# Patient Record
Sex: Female | Born: 1985 | Race: White | Hispanic: No | Marital: Single | Smoking: Never smoker
Health system: Southern US, Community
[De-identification: ages and names within clinical notes are randomized; demographics above are authoritative.]

## PROBLEM LIST (undated history)

## (undated) DIAGNOSIS — Q774 Achondroplasia: Secondary | ICD-10-CM

## (undated) DIAGNOSIS — D649 Anemia, unspecified: Secondary | ICD-10-CM

## (undated) HISTORY — PX: MANDIBLE SURGERY: SHX707

## (undated) HISTORY — PX: LEG SURGERY: SHX1003

## (undated) HISTORY — PX: GASTRIC BYPASS: SHX52

## (undated) HISTORY — DX: Anemia, unspecified: D64.9

## (undated) HISTORY — PX: TYMPANOSTOMY TUBE PLACEMENT: SHX32

## (undated) HISTORY — DX: Achondroplasia: Q77.4

---

## 2016-08-11 ENCOUNTER — Emergency Department (HOSPITAL_COMMUNITY)
Admission: EM | Admit: 2016-08-11 | Discharge: 2016-08-11 | Disposition: A | Discharge status: Home / Self Care | Payer: No Typology Code available for payment source | Attending: Emergency Medicine | Admitting: Emergency Medicine

## 2016-08-11 ENCOUNTER — Encounter (HOSPITAL_COMMUNITY): Payer: Self-pay | Admitting: Emergency Medicine

## 2016-08-11 ENCOUNTER — Emergency Department (HOSPITAL_COMMUNITY): Payer: No Typology Code available for payment source

## 2016-08-11 DIAGNOSIS — M542 Cervicalgia: Secondary | ICD-10-CM | POA: Diagnosis not present

## 2016-08-11 DIAGNOSIS — Y999 Unspecified external cause status: Secondary | ICD-10-CM | POA: Diagnosis not present

## 2016-08-11 DIAGNOSIS — Y92410 Unspecified street and highway as the place of occurrence of the external cause: Secondary | ICD-10-CM | POA: Diagnosis not present

## 2016-08-11 DIAGNOSIS — R079 Chest pain, unspecified: Secondary | ICD-10-CM | POA: Diagnosis not present

## 2016-08-11 DIAGNOSIS — Y939 Activity, unspecified: Secondary | ICD-10-CM | POA: Diagnosis not present

## 2016-08-11 LAB — PREGNANCY, URINE: Preg Test, Ur: NEGATIVE

## 2016-08-11 MED ORDER — IBUPROFEN 200 MG PO TABS
400.0000 mg | ORAL_TABLET | Freq: Once | ORAL | Status: AC
  Administered 2016-08-11: 400 mg via ORAL
  Filled 2016-08-11: qty 2

## 2016-08-11 MED ORDER — CYCLOBENZAPRINE HCL 10 MG PO TABS
5.0000 mg | ORAL_TABLET | Freq: Once | ORAL | Status: AC
  Administered 2016-08-11: 5 mg via ORAL
  Filled 2016-08-11: qty 1

## 2016-08-11 MED ORDER — IBUPROFEN 400 MG PO TABS: 400.0000 mg | ORAL_TABLET | Freq: Four times a day (QID) | ORAL | 0 refills | Status: DC | PRN

## 2016-08-11 MED ORDER — CYCLOBENZAPRINE HCL 5 MG PO TABS: 5.0000 mg | ORAL_TABLET | Freq: Two times a day (BID) | ORAL | 0 refills | Status: DC | PRN
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
## 2018-03-16 ENCOUNTER — Ambulatory Visit: Payer: Self-pay | Admitting: Obstetrics & Gynecology

## 2018-04-19 ENCOUNTER — Encounter: Payer: Self-pay | Admitting: Obstetrics & Gynecology

## 2018-04-19 ENCOUNTER — Ambulatory Visit (INDEPENDENT_AMBULATORY_CARE_PROVIDER_SITE_OTHER): Payer: BC Managed Care – PPO | Admitting: Obstetrics & Gynecology

## 2018-04-19 VITALS — BP 120/80 | Ht <= 58 in | Wt 150.0 lb

## 2018-04-19 DIAGNOSIS — Z0000 Encounter for general adult medical examination without abnormal findings: Secondary | ICD-10-CM | POA: Diagnosis not present

## 2018-04-19 DIAGNOSIS — Z124 Encounter for screening for malignant neoplasm of cervix: Secondary | ICD-10-CM

## 2018-04-19 DIAGNOSIS — N9419 Other specified dyspareunia: Secondary | ICD-10-CM

## 2018-04-19 NOTE — Patient Instructions (Signed)
PAP every three years Labs yearly (with PCP)   Dyspareunia, Female Dyspareunia is pain that is associated with sexual activity. This can affect any part of the genitals or lower abdomen, and there are many possible causes. This condition ranges from mild to severe. Depending on the cause, dyspareunia may get better with treatment, or it may return (recur) over time. What are the causes? The cause of this condition is not always known. Possible causes include:  Cancer.  Psychological factors, such as depression, anxiety, or previous traumatic experiences.  Severe pain and tenderness of the skin around the vagina (vulva) when it is touched (vulvar vestibulitis syndrome).  Infection of the pelvis or the vulva.  Infection of the vagina.  Painful, involuntary tightening (contraction) of the vaginal muscles when anything is put inside the vagina (vaginismus).  Allergic reaction.  Ovarian cysts.  Solid growths of tissue (tumors) in the ovaries or the uterus.  Scar tissue in the ovaries, vagina, or pelvis.  Vaginal dryness.  Thinning of the tissue (atrophy) of the vulva and vagina.  Skin conditions that affect the vulva (vulvar dermatoses), such as lichen sclerosus or lichen planus.  Endometriosis.  Tubal pregnancy.  A tilted uterus.  Uterine prolapse.  Adhesions in the vagina.  Bladder problems.  Intestinal problems.  Certain medicines.  Medical conditions such as diabetes, arthritis, or thyroid disease.  What increases the risk? The following factors may make you more likely to develop this condition:  Having experienced physical or sexual trauma.  Having given birth more than once.  Taking birth control pills.  Having gone through menopause.  Having recently given birth, typically within the past 3-6 months.  Breastfeeding.  What are the signs or symptoms? The main symptom of this condition is pain in any part of the genitals or lower abdomen during or  after sexual activity. This may include pain during sexual arousal, genital stimulation, or orgasm. Pain may get worse when anything is inserted into the vagina, or when the genitals are touched in any way, such as when sitting or wearing pants. Pain can range from mild to severe, depending on the cause of the condition. In some cases, symptoms go away with treatment and return (recur) at a later date. How is this diagnosed? This condition may be diagnosed based on:  Your symptoms, including: ? Where your pain is located. ? When your pain occurs.  Your medical history.  A physical exam. This may include a pelvic exam and a Pap test. This is a screening test that is used to check for signs of cancer of the vagina, cervix, and uterus.  Tests, including: ? Blood tests. ? Ultrasound. This uses sound waves to make a picture of the area that is being tested. ? Urine culture. This test involves checking a urine sample for signs of infection. ? Culture test. This is when your health care provider uses a swab to collect a sample of vaginal fluid. The sample is checked for signs of infection. ? X-rays. ? MRI. ? CT scan. ? Laparoscopy. This is a procedure in which a small incision is made in your lower abdomen and a lighted, pencil-sized instrument (laparoscope) is passed through the incision and used to look inside your pelvis.  You may be referred to a health care provider who specializes in women's health (gynecologist). In some cases, diagnosing the cause of dyspareunia can be difficult. How is this treated? Treatment depends on the cause of your condition and your symptoms. In most cases,  you may need to stop sexual activity until your symptoms improve. Treatment may include:  Lubricants.  Kegel exercises or vaginal dilators.  Medicated skin creams.  Medicated vaginal creams.  Hormonal therapy.  Antibiotic medicine to prevent or fight infection.  Medicines that help to relieve  pain.  Medicines that treat depression (antidepressants).  Psychological counseling.  Sex therapy.  Surgery.  Follow these instructions at home: Lifestyle  Avoid tight clothing and irritating materials around your genital and abdominal area.  Use water-based lubricants as needed. Avoid oil-based lubricants.  Do not use any products that irritate you. This may include certain condoms, spermicides, lubricants, soaps, tampons, vaginal sprays, or douches.  Always practice safe sex. Talk with your health care provider about which form of birth control (contraception) is best for you.  Maintain open communication with your sexual partner. General instructions  Take over-the-counter and prescription medicines only as told by your health care provider.  If you had tests done, it is your responsibility to get your tests results. Ask your health care provider or the department performing the test when your results will be ready.  Urinate before you engage in sexual activity.  Consider joining a support group.  Keep all follow-up visits as told by your health care provider. This is important. Contact a health care provider if:  You develop vaginal bleeding after sexual intercourse.  You develop a lump at the opening of your vagina. Seek medical care even if the lump is painless.  You have: ? Abnormal vaginal discharge. ? Vaginal dryness. ? Itchiness or irritation of your vulva or vagina. ? A new rash. ? Symptoms that get worse or do not improve with treatment. ? A fever. ? Pain when you urinate. ? Blood in your urine. Get help right away if:  You develop severe pain in your abdomen during or shortly after sexual intercourse.  You pass out after having sexual intercourse. This information is not intended to replace advice given to you by your health care provider. Make sure you discuss any questions you have with your health care provider. Document Released: 11/16/2007  Document Revised: 03/07/2016 Document Reviewed: 05/29/2015 Elsevier Interactive Patient Education  [employer]2018 [employer] Inc[employer]

## 2018-04-19 NOTE — Progress Notes (Signed)
HPI:      [name] is a 32 y.o. G0P0000 who LMP was Patient's last menstrual period was 03/29/2018., she presents today for her annual examination. The patient has no complaints today. The patient is sexually active. Her last pap: was normal. The patient does perform self breast exams.  There is no notable family history of breast or ovarian cancer in her family.  The patient has regular exercise: yes.  The patient denies current symptoms of depression.    GYN History: Contraception: none  PMHx: Past Medical History:  Diagnosis Date  . Dwarf, achondroplastic    Past Surgical History:  Procedure Laterality Date  . GASTRIC BYPASS     History reviewed. No pertinent family history. Social History   Tobacco Use  . Smoking status: Never Smoker  . Smokeless tobacco: Never Used  Substance Use Topics  . Alcohol use: No  . Drug use: Never    Current Outpatient Medications:  .  cyclobenzaprine (FLEXERIL) 5 MG tablet, Take 1 tablet (5 mg total) by mouth 2 (two) times daily as needed for muscle spasms., Disp: 10 tablet, Rfl: 0 .  ibuprofen (ADVIL,MOTRIN) 400 MG tablet, Take 1 tablet (400 mg total) by mouth every 6 (six) hours as needed., Disp: 30 tablet, Rfl: 0 Allergies: Sulfa antibiotics  Review of Systems  Constitutional: Negative for chills, fever and malaise/fatigue.  HENT: Negative for congestion, sinus pain and sore throat.   Eyes: Negative for blurred vision and pain.  Respiratory: Negative for cough and wheezing.   Cardiovascular: Negative for chest pain and leg swelling.  Gastrointestinal: Negative for abdominal pain, constipation, diarrhea, heartburn, nausea and vomiting.  Genitourinary: Negative for dysuria, frequency, hematuria and urgency.  Musculoskeletal: Negative for back pain, joint pain, myalgias and neck pain.  Skin: Negative for itching and rash.  Neurological: Negative for dizziness, tremors and weakness.  Endo/Heme/Allergies: Does not bruise/bleed  easily.  Psychiatric/Behavioral: Negative for depression. The patient is not nervous/anxious and does not have insomnia.     Objective: BP 120/80   Ht 4\' 2"  (1.27 m)   Wt 150 lb (68 kg)   LMP 03/29/2018   BMI 42.18 kg/m   Filed Weights   04/19/18 1542  Weight: 150 lb (68 kg)   Body mass index is 42.18 kg/m. Physical Exam  Constitutional: She is oriented to person, place, and time. She appears well-developed and well-nourished. No distress.  Genitourinary: Rectum normal, vagina normal and uterus normal. Pelvic exam was performed with patient supine. There is no rash or lesion on the right labia. There is no rash or lesion on the left labia. Vagina exhibits no lesion. No bleeding in the vagina. Right adnexum does not display mass and does not display tenderness. Left adnexum does not display mass and does not display tenderness. Cervix does not exhibit motion tenderness, lesion, friability or polyp.   Uterus is mobile and midaxial. Uterus is not enlarged or exhibiting a mass.  HENT:  Head: Normocephalic and atraumatic. Head is without laceration.  Right Ear: Hearing normal.  Left Ear: Hearing normal.  Nose: No epistaxis.  No foreign bodies.  Mouth/Throat: Uvula is midline, oropharynx is clear and moist and mucous membranes are normal.  Eyes: Pupils are equal, round, and reactive to light.  Neck: Normal range of motion. Neck supple. No thyromegaly present.  Cardiovascular: Normal rate and regular rhythm. Exam reveals no gallop and no friction rub.  No murmur heard. Pulmonary/Chest: Effort normal and breath sounds normal. No respiratory distress. She  has no wheezes. Right breast exhibits no mass, no skin change and no tenderness. Left breast exhibits no mass, no skin change and no tenderness.  Abdominal: Soft. Bowel sounds are normal. She exhibits no distension. There is no tenderness. There is no rebound.  Musculoskeletal: Normal range of motion.  Neurological: She is alert and  oriented to person, place, and time. No cranial nerve deficit.  Skin: Skin is warm and dry.  Psychiatric: She has a normal mood and affect. Judgment normal.  Vitals reviewed.   Assessment:  ANNUAL EXAM 1. Annual physical exam   2. Dyspareunia due to medical condition in female   3. Screening for cervical cancer     Screening Plan:            1.  Cervical Screening-  Pap smear done today  2. Labs managed by PCP  3. Counseling for contraception: no method currently; considering pill or patch until ready for fertility.  Counsel on 50% rate of passing dwarfism gene to child, and higher CS rate.  Other:  1. Annual physical exam  2. Dyspareunia due to medical condition in female US for assessment Endometriosis and other etiologies discussed as possible for dyspareunia Desires fertility someday    F/U  Return in about 1 week (around 04/26/2018) for Follow up w GYN US.  Annamarie MajorPaul Gurvir Schrom, MD, Merlinda FrederickFACOG [facility], [facility] 04/19/2018  4:10 PM

## 2018-04-22 ENCOUNTER — Encounter: Payer: Self-pay | Admitting: Obstetrics & Gynecology

## 2018-04-22 LAB — IGP, APTIMA HPV
HPV Aptima: POSITIVE — AB
PAP SMEAR COMMENT: 0

## 2018-04-22 NOTE — Progress Notes (Signed)
PAP w HPV, otherwise normal Plan PAP next year Letter to pt

## 2018-05-10 ENCOUNTER — Other Ambulatory Visit: Payer: BC Managed Care – PPO

## 2018-05-10 ENCOUNTER — Telehealth: Payer: Self-pay | Admitting: Obstetrics & Gynecology

## 2018-05-10 ENCOUNTER — Ambulatory Visit: Payer: BC Managed Care – PPO | Admitting: Obstetrics & Gynecology

## 2018-05-10 ENCOUNTER — Other Ambulatory Visit: Payer: Self-pay | Admitting: Obstetrics & Gynecology

## 2018-05-10 DIAGNOSIS — N9419 Other specified dyspareunia: Secondary | ICD-10-CM

## 2018-05-10 NOTE — Telephone Encounter (Signed)
Please put in new order for pt. Since u/s is not in today she needed to be rescheduled and the time I could bring her in was outside of the u/s order dates so it won't allow me to schedule it.

## 2018-05-20 ENCOUNTER — Ambulatory Visit (INDEPENDENT_AMBULATORY_CARE_PROVIDER_SITE_OTHER): Payer: BC Managed Care – PPO | Admitting: Obstetrics & Gynecology

## 2018-05-20 ENCOUNTER — Ambulatory Visit (INDEPENDENT_AMBULATORY_CARE_PROVIDER_SITE_OTHER): Payer: BC Managed Care – PPO

## 2018-05-20 ENCOUNTER — Encounter: Payer: Self-pay | Admitting: Obstetrics & Gynecology

## 2018-05-20 VITALS — BP 110/80 | Ht <= 58 in | Wt 150.0 lb

## 2018-05-20 DIAGNOSIS — B977 Papillomavirus as the cause of diseases classified elsewhere: Secondary | ICD-10-CM | POA: Diagnosis not present

## 2018-05-20 DIAGNOSIS — N9419 Other specified dyspareunia: Secondary | ICD-10-CM | POA: Diagnosis not present

## 2018-05-20 NOTE — Progress Notes (Signed)
  HPI: Pt has been having dyspareunia, it has been intermittant and for the last week no pain.    Ultrasound demonstrates no masses seen, no cyst, no other anamaly These findings are Pelvis normal  PMHx: She  has a past medical history of Dwarf, achondroplastic. Also,  has a past surgical history that includes Gastric bypass., family history is not on file.,  reports that she has never smoked. She has never used smokeless tobacco. She reports that she does not drink alcohol or use drugs.  She has a current medication list which includes the following prescription(s): cyclobenzaprine and ibuprofen. Also, is allergic to sulfa antibiotics.  Review of Systems  All other systems reviewed and are negative.  Objective: BP 110/80   Ht 4\' 2"  (1.27 m)   Wt 150 lb (68 kg)   LMP 05/06/2018   BMI 42.18 kg/m   Physical examination Constitutional NAD, Conversant  Skin No rashes, lesions or ulceration.   Extremities: Moves all appropriately.  Normal ROM for age. No lymphadenopathy.  Neuro: Grossly intact  Psych: Oriented to PPT.  Normal mood. Normal affect.   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  Annamarie MajorPaul Woodrow Drab, MD, Merlinda FrederickFACOG [facility], [facility] 05/20/2018  2:01 PM

## 2020-07-18 ENCOUNTER — Other Ambulatory Visit: Payer: Self-pay | Admitting: Physician Assistant

## 2020-07-18 ENCOUNTER — Ambulatory Visit (HOSPITAL_COMMUNITY)
Admission: RE | Admit: 2020-07-18 | Discharge: 2020-07-18 | Disposition: A | Discharge status: Home / Self Care | Payer: BC Managed Care – PPO | Source: Ambulatory Visit | Attending: Pulmonary Disease | Admitting: Pulmonary Disease

## 2020-07-18 DIAGNOSIS — E669 Obesity, unspecified: Secondary | ICD-10-CM

## 2020-07-18 DIAGNOSIS — U071 COVID-19: Secondary | ICD-10-CM

## 2020-07-18 DIAGNOSIS — E34328 Other genetic causes of short stature: Secondary | ICD-10-CM

## 2020-07-18 DIAGNOSIS — E343 Short stature due to endocrine disorder: Secondary | ICD-10-CM | POA: Diagnosis present

## 2020-07-18 MED ORDER — DIPHENHYDRAMINE HCL 50 MG/ML IJ SOLN: 50.0000 mg | Freq: Once | INTRAMUSCULAR | Status: DC | PRN

## 2020-07-18 MED ORDER — SODIUM CHLORIDE 0.9 % IV SOLN
1200.0000 mg | Freq: Once | INTRAVENOUS | Status: AC
  Administered 2020-07-18: 1200 mg via INTRAVENOUS
  Filled 2020-07-18: qty 10

## 2020-07-18 MED ORDER — ALBUTEROL SULFATE HFA 108 (90 BASE) MCG/ACT IN AERS: 2.0000 | INHALATION_SPRAY | Freq: Once | RESPIRATORY_TRACT | Status: DC | PRN

## 2020-07-18 MED ORDER — SODIUM CHLORIDE 0.9 % IV SOLN: INTRAVENOUS | Status: DC | PRN

## 2020-07-18 MED ORDER — METHYLPREDNISOLONE SODIUM SUCC 125 MG IJ SOLR: 125.0000 mg | Freq: Once | INTRAMUSCULAR | Status: DC | PRN

## 2020-07-18 MED ORDER — FAMOTIDINE IN NACL 20-0.9 MG/50ML-% IV SOLN: 20.0000 mg | Freq: Once | INTRAVENOUS | Status: DC | PRN

## 2020-07-18 MED ORDER — EPINEPHRINE 0.3 MG/0.3ML IJ SOAJ: 0.3000 mg | Freq: Once | INTRAMUSCULAR | Status: DC | PRN

## 2020-07-18 NOTE — Progress Notes (Signed)
I connected by phone with Gina Webster on 07/18/2020 at 11:34 AM to discuss the potential use of a new treatment for mild to moderate COVID-19 viral infection in non-hospitalized patients.  This patient is a 34 y.o. female that meets the FDA criteria for Emergency Use Authorization of COVID monoclonal antibody casirivimab/imdevimab.  Has a (+) direct SARS-CoV-2 viral test result  Has mild or moderate COVID-19   Is NOT hospitalized due to COVID-19  Is within 10 days of symptom onset  Has at least one of the high risk factor(s) for progression to severe COVID-19 and/or hospitalization as defined in EUA.  Specific high risk criteria : BMI > 25 and Other high risk medical condition per CDC:  Dwarfism   I have spoken and communicated the following to the patient or parent/caregiver regarding COVID monoclonal antibody treatment:  1. FDA has authorized the emergency use for the treatment of mild to moderate COVID-19 in adults and pediatric patients with positive results of direct SARS-CoV-2 viral testing who are 30 years of age and older weighing at least 40 kg, and who are at high risk for progressing to severe COVID-19 and/or hospitalization.  2. The significant known and potential risks and benefits of COVID monoclonal antibody, and the extent to which such potential risks and benefits are unknown.  3. Information on available alternative treatments and the risks and benefits of those alternatives, including clinical trials.  4. Patients treated with COVID monoclonal antibody should continue to self-isolate and use infection control measures (e.g., wear mask, isolate, social distance, avoid sharing personal items, clean and disinfect "high touch" surfaces, and frequent handwashing) according to CDC guidelines.   5. The patient or parent/caregiver has the option to accept or refuse COVID monoclonal antibody treatment.  After reviewing this information with the patient, The patient agreed  to proceed with receiving casirivimab\imdevimab infusion and will be provided a copy of the Fact sheet prior to receiving the infusion.   Sx onset 9/6. Set up for infusion on 9/8 @ 12:30. Directions given to [facility]. Pt is aware that insurance will be charged an infusion fee. She is fully vaccinated with Moderna.   Cline Crock 07/18/2020 11:34 AM

## 2020-07-18 NOTE — Discharge Instructions (Signed)

## 2020-07-18 NOTE — Progress Notes (Signed)
  Diagnosis: COVID-19  Physician: Dr Wright  Procedure: [facility] Med: casirivimab\imdevimab infusion - Provided patient with casirivimab\imdevimab fact sheet for patients, parents and caregivers prior to infusion.  Complications: No immediate complications noted.  Discharge: Discharged home   Brad Lieurance Jane 07/18/2020   

## 2020-07-19 ENCOUNTER — Ambulatory Visit: Payer: BC Managed Care – PPO | Admitting: Obstetrics & Gynecology

## 2020-08-15 ENCOUNTER — Ambulatory Visit: Payer: BC Managed Care – PPO | Admitting: Obstetrics & Gynecology

## 2020-09-07 ENCOUNTER — Ambulatory Visit: Payer: BC Managed Care – PPO | Admitting: Obstetrics & Gynecology

## 2020-11-07 ENCOUNTER — Other Ambulatory Visit (HOSPITAL_COMMUNITY)
Admission: RE | Admit: 2020-11-07 | Discharge: 2020-11-07 | Disposition: A | Discharge status: Home / Self Care | Payer: BC Managed Care – PPO | Source: Ambulatory Visit | Attending: Obstetrics & Gynecology | Admitting: Obstetrics & Gynecology

## 2020-11-07 ENCOUNTER — Other Ambulatory Visit: Payer: Self-pay

## 2020-11-07 ENCOUNTER — Encounter: Payer: Self-pay | Admitting: Obstetrics & Gynecology

## 2020-11-07 ENCOUNTER — Ambulatory Visit (INDEPENDENT_AMBULATORY_CARE_PROVIDER_SITE_OTHER): Payer: BC Managed Care – PPO | Admitting: Obstetrics & Gynecology

## 2020-11-07 VITALS — BP 120/80 | Wt 142.0 lb

## 2020-11-07 DIAGNOSIS — Z124 Encounter for screening for malignant neoplasm of cervix: Secondary | ICD-10-CM | POA: Insufficient documentation

## 2020-11-07 DIAGNOSIS — Z01419 Encounter for gynecological examination (general) (routine) without abnormal findings: Secondary | ICD-10-CM

## 2020-11-07 NOTE — Progress Notes (Signed)
HPI:      [name] is a 34 y.o. G0P0000 who LMP was Patient's last menstrual period was 10/23/2020., she presents today for her annual examination. The patient has no complaints today. The patient is sexually active. Her last pap: approximate date 2019 and was normal except for POS HPV.  The patient does perform self breast exams.  There is no notable family history of breast or ovarian cancer in her family.  The patient has regular exercise: yes.  The patient denies current symptoms of depression.    GYN History: Contraception: none  Periods reg and 3-4 days and heavy 2-3 days Planning wedding in Oct 2022, possible pregnancy thereafter  PMHx: Past Medical History:  Diagnosis Date  . Dwarf, achondroplastic    Past Surgical History:  Procedure Laterality Date  . GASTRIC BYPASS     History reviewed. No pertinent family history. Social History   Tobacco Use  . Smoking status: Never Smoker  . Smokeless tobacco: Never Used  Vaping Use  . Vaping Use: Never used  Substance Use Topics  . Alcohol use: No  . Drug use: Never    Current Outpatient Medications:  .  cyclobenzaprine (FLEXERIL) 5 MG tablet, Take 1 tablet (5 mg total) by mouth 2 (two) times daily as needed for muscle spasms. (Patient not taking: Reported on 11/07/2020), Disp: 10 tablet, Rfl: 0 .  ibuprofen (ADVIL,MOTRIN) 400 MG tablet, Take 1 tablet (400 mg total) by mouth every 6 (six) hours as needed. (Patient not taking: Reported on 11/07/2020), Disp: 30 tablet, Rfl: 0 Allergies: Sulfa antibiotics  Review of Systems  Constitutional: Negative for chills, fever and malaise/fatigue.  HENT: Negative for congestion, sinus pain and sore throat.   Eyes: Negative for blurred vision and pain.  Respiratory: Negative for cough and wheezing.   Cardiovascular: Negative for chest pain and leg swelling.  Gastrointestinal: Negative for abdominal pain, constipation, diarrhea, heartburn, nausea and vomiting.   Genitourinary: Negative for dysuria, frequency, hematuria and urgency.  Musculoskeletal: Negative for back pain, joint pain, myalgias and neck pain.  Skin: Negative for itching and rash.  Neurological: Negative for dizziness, tremors and weakness.  Endo/Heme/Allergies: Does not bruise/bleed easily.  Psychiatric/Behavioral: Negative for depression. The patient is not nervous/anxious and does not have insomnia.     Objective: BP 120/80   Wt 142 lb (64.4 kg)   LMP 10/23/2020   BMI 39.93 kg/m   Filed Weights   11/07/20 1522  Weight: 142 lb (64.4 kg)   Body mass index is 39.93 kg/m. Physical Exam Constitutional:      General: She is not in acute distress.    Appearance: She is well-developed and well-nourished.  Genitourinary:     Vagina, uterus and rectum normal.     There is no rash or lesion on the right labia.     There is no rash or lesion on the left labia.    No lesions in the vagina.     No vaginal bleeding.      Right Adnexa: not tender and no mass present.    Left Adnexa: not tender and no mass present.    No cervical motion tenderness, friability, lesion or polyp.     Uterus is mobile.     Uterus is not enlarged.     No uterine mass detected.    Uterus is midaxial.     Pelvic exam was performed with patient supine.  Breasts:     Right: No mass, skin change or  tenderness.     Left: No mass, skin change or tenderness.    HENT:     Head: Normocephalic and atraumatic. No laceration.     Right Ear: Hearing normal.     Left Ear: Hearing normal.     Nose: No epistaxis or foreign body.     Mouth/Throat:     Mouth: Oropharynx is clear and moist and mucous membranes are normal.     Pharynx: Uvula midline.  Eyes:     Pupils: Pupils are equal, round, and reactive to light.  Neck:     Thyroid: No thyromegaly.  Cardiovascular:     Rate and Rhythm: Normal rate and regular rhythm.     Heart sounds: No murmur heard. No friction rub. No gallop.   Pulmonary:      Effort: Pulmonary effort is normal. No respiratory distress.     Breath sounds: Normal breath sounds. No wheezing.  Abdominal:     General: Bowel sounds are normal. There is no distension.     Palpations: Abdomen is soft.     Tenderness: There is no abdominal tenderness. There is no rebound.  Musculoskeletal:        General: Normal range of motion.     Cervical back: Normal range of motion and neck supple.  Neurological:     Mental Status: She is alert and oriented to person, place, and time.     Cranial Nerves: No cranial nerve deficit.  Skin:    General: Skin is warm and dry.  Psychiatric:        Mood and Affect: Mood and affect normal.        Judgment: Judgment normal.  Vitals reviewed.     Assessment:  ANNUAL EXAM 1. Women's annual routine gynecological examination   2. Screening for cervical cancer      Screening Plan:            1.  Cervical Screening-  Pap smear done today  2. Breast screening- Exam annually and mammogram>40 planned   3.  Labs managed by PCP  4. Counseling for contraception: no method Plan son pregnancy after marriage in Oct 2022    F/U  Return in about 1 year (around 11/07/2021) for Annual.  Annamarie Major, MD, [facility], [facility] 11/07/2020  3:53 PM

## 2020-11-12 LAB — CYTOLOGY - PAP
Comment: NEGATIVE
Diagnosis: NEGATIVE
High risk HPV: NEGATIVE

## 2020-11-13 ENCOUNTER — Telehealth: Payer: Self-pay | Admitting: Obstetrics & Gynecology

## 2020-11-13 NOTE — Telephone Encounter (Signed)
Patient is calling for labs results. Please advise. 

## 2020-11-14 NOTE — Telephone Encounter (Signed)
PAP is normal and this is great news!  Follow up next year. Message left for patient

## 2020-12-13 ENCOUNTER — Ambulatory Visit: Payer: BC Managed Care – PPO | Admitting: Obstetrics & Gynecology

## 2020-12-14 ENCOUNTER — Ambulatory Visit (INDEPENDENT_AMBULATORY_CARE_PROVIDER_SITE_OTHER): Payer: BC Managed Care – PPO | Admitting: Obstetrics & Gynecology

## 2020-12-14 ENCOUNTER — Encounter: Payer: Self-pay | Admitting: Obstetrics & Gynecology

## 2020-12-14 ENCOUNTER — Other Ambulatory Visit: Payer: Self-pay

## 2020-12-14 VITALS — BP 118/74 | Wt 144.0 lb

## 2020-12-14 DIAGNOSIS — N926 Irregular menstruation, unspecified: Secondary | ICD-10-CM

## 2020-12-14 LAB — POCT URINE PREGNANCY: Preg Test, Ur: POSITIVE — AB

## 2020-12-14 NOTE — Progress Notes (Signed)
  HPI:      [name] is a 35 y.o. G0P0000 who LMP was Patient's last menstrual period was 10/26/2020., presents today for a problem visit.  She complains of missing a period but has been somewhat irregular, w LMP 10/26/20, and with dietary changes for weight loss felt it was related to that.  Then she took home preg test and one was positive.  Has breast T and nausea (mild).  No bleeding. Engaged.  OK w pregnancy if positive.  Pt has achondroplasia.  PMHx: She  has a past medical history of Dwarf, achondroplastic. Also,  has a past surgical history that includes Gastric bypass., family history is not on file.,  reports that she has never smoked. She has never used smokeless tobacco. She reports that she does not drink alcohol and does not use drugs.  She  Current Outpatient Medications:  .  cyclobenzaprine (FLEXERIL) 5 MG tablet, Take 1 tablet (5 mg total) by mouth 2 (two) times daily as needed for muscle spasms. (Patient not taking: No sig reported), Disp: 10 tablet, Rfl: 0 .  ibuprofen (ADVIL,MOTRIN) 400 MG tablet, Take 1 tablet (400 mg total) by mouth every 6 (six) hours as needed. (Patient not taking: No sig reported), Disp: 30 tablet, Rfl: 0  Also, is allergic to sulfa antibiotics.  Review of Systems  Constitutional: Negative for chills, fever and malaise/fatigue.  HENT: Negative for congestion, sinus pain and sore throat.   Eyes: Negative for blurred vision and pain.  Respiratory: Negative for cough and wheezing.   Cardiovascular: Negative for chest pain and leg swelling.  Gastrointestinal: Negative for abdominal pain, constipation, diarrhea, heartburn, nausea and vomiting.  Genitourinary: Negative for dysuria, frequency, hematuria and urgency.  Musculoskeletal: Negative for back pain, joint pain, myalgias and neck pain.  Skin: Negative for itching and rash.  Neurological: Negative for dizziness, tremors and weakness.  Endo/Heme/Allergies: Does not bruise/bleed easily.   Psychiatric/Behavioral: Negative for depression. The patient is not nervous/anxious and does not have insomnia.     Objective: BP 118/74   Wt 144 lb (65.3 kg)   LMP 10/26/2020   BMI 40.50 kg/m  Physical Exam Constitutional:      General: She is not in acute distress.    Appearance: Normal appearance. She is overweight and well-nourished.  Musculoskeletal:        General: Normal range of motion.  Neurological:     Mental Status: She is alert and oriented to person, place, and time.  Skin:    General: Skin is warm and dry.  Psychiatric:        Mood and Affect: Mood and affect normal.  Vitals reviewed.   uCG POS  ASSESSMENT/PLAN: Missed period, likely early pregnancy   Problem List Items Addressed This Visit    Visit Diagnoses    Missed period    -  Primary   Relevant Orders   POCT urine pregnancy (Completed)   Beta hCG quant (ref lab)   US OB LESS THAN 14 WEEKS WITH OB TRANSVAGINAL    PNV, recommendations made for pregnancy Plan beta and then [place] to assess  Annamarie Major, MD, [facility], [facility] 12/14/2020  5:26 PM

## 2020-12-17 ENCOUNTER — Other Ambulatory Visit: Payer: Self-pay

## 2020-12-17 ENCOUNTER — Other Ambulatory Visit: Payer: BC Managed Care – PPO

## 2020-12-17 DIAGNOSIS — N926 Irregular menstruation, unspecified: Secondary | ICD-10-CM

## 2020-12-18 LAB — BETA HCG QUANT (REF LAB): hCG Quant: 166667 m[IU]/mL

## 2020-12-20 ENCOUNTER — Other Ambulatory Visit: Payer: Self-pay

## 2020-12-20 ENCOUNTER — Ambulatory Visit (INDEPENDENT_AMBULATORY_CARE_PROVIDER_SITE_OTHER): Payer: BC Managed Care – PPO | Admitting: Obstetrics & Gynecology

## 2020-12-20 ENCOUNTER — Encounter: Payer: BC Managed Care – PPO | Admitting: Obstetrics

## 2020-12-20 ENCOUNTER — Encounter: Payer: Self-pay | Admitting: Obstetrics & Gynecology

## 2020-12-20 ENCOUNTER — Ambulatory Visit (INDEPENDENT_AMBULATORY_CARE_PROVIDER_SITE_OTHER): Payer: BC Managed Care – PPO

## 2020-12-20 VITALS — BP 120/80 | Wt 143.0 lb

## 2020-12-20 DIAGNOSIS — N926 Irregular menstruation, unspecified: Secondary | ICD-10-CM | POA: Diagnosis not present

## 2020-12-20 DIAGNOSIS — Z3A08 8 weeks gestation of pregnancy: Secondary | ICD-10-CM | POA: Diagnosis not present

## 2020-12-20 DIAGNOSIS — Q774 Achondroplasia: Secondary | ICD-10-CM | POA: Insufficient documentation

## 2020-12-20 LAB — POCT URINALYSIS DIPSTICK OB
Glucose, UA: NEGATIVE
POC,PROTEIN,UA: NEGATIVE

## 2020-12-20 NOTE — Patient Instructions (Signed)
Thank you for choosing [facility]. As part of our ongoing efforts to improve patient experience, we would appreciate your feedback. Please fill out the short survey that you will receive by mail or MyChart. Your opinion is important to us! -Dr Dane Bloch  Commonly Asked Questions During Pregnancy  Cats: A parasite can be excreted in cat feces.  To avoid exposure you need to have another person empty the little box.  If you must empty the litter box you will need to wear gloves.  Wash your hands after handling your cat.  This parasite can also be found in raw or undercooked meat so this should also be avoided.  Colds, Sore Throats, Flu: Please check your medication sheet to see what you can take for symptoms.  If your symptoms are unrelieved by these medications please call the office.  Dental Work: Most any dental work your dentist recommends is permitted.  X-rays should only be taken during the first trimester if absolutely necessary.  Your abdomen should be shielded with a lead apron during all x-rays.  Please notify your provider prior to receiving any x-rays.  Novocaine is fine; gas is not recommended.  If your dentist requires a note from us prior to dental work please call the office and we will provide one for you.  Exercise: Exercise is an important part of staying healthy during your pregnancy.  You may continue most exercises you were accustomed to prior to pregnancy.  Later in your pregnancy you will most likely notice you have difficulty with activities requiring balance like riding a bicycle.  It is important that you listen to your body and avoid activities that put you at a higher risk of falling.  Adequate rest and staying well hydrated are a must!  If you have questions about the safety of specific activities ask your provider.    Exposure to Children with illness: Try to avoid obvious exposure; report any symptoms to us when noted,  If you have chicken pos, red measles or mumps, you  should be immune to these diseases.   Please do not take any vaccines while pregnant unless you have checked with your OB provider.  Fetal Movement: After 28 weeks we recommend you do "kick counts" twice daily.  Lie or sit down in a calm quiet environment and count your baby movements "kicks".  You should feel your baby at least 10 times per hour.  If you have not felt 10 kicks within the first hour get up, walk around and have something sweet to eat or drink then repeat for an additional hour.  If count remains less than 10 per hour notify your provider.  Fumigating: Follow your pest control agent's advice as to how long to stay out of your home.  Ventilate the area well before re-entering.  Hemorrhoids:   Most over-the-counter preparations can be used during pregnancy.  Check your medication to see what is safe to use.  It is important to use a stool softener or fiber in your diet and to drink lots of liquids.  If hemorrhoids seem to be getting worse please call the office.   Hot Tubs:  Hot tubs Jacuzzis and saunas are not recommended while pregnant.  These increase your internal body temperature and should be avoided.  Intercourse:  Sexual intercourse is safe during pregnancy as long as you are comfortable, unless otherwise advised by your provider.  Spotting may occur after intercourse; report any bright red bleeding that is heavier than spotting.    Labor:  If you know that you are in labor, please go to the hospital.  If you are unsure, please call the office and let us help you decide what to do.  Lifting, straining, etc:  If your job requires heavy lifting or straining please check with your provider for any limitations.  Generally, you should not lift items heavier than that you can lift simply with your hands and arms (no back muscles)  Painting:  Paint fumes do not harm your pregnancy, but may make you ill and should be avoided if possible.  Latex or water based paints have less odor than  oils.  Use adequate ventilation while painting.  Permanents & Hair Color:  Chemicals in hair dyes are not recommended as they cause increase hair dryness which can increase hair loss during pregnancy.  " Highlighting" and permanents are allowed.  Dye may be absorbed differently and permanents may not hold as well during pregnancy.  Sunbathing:  Use a sunscreen, as skin burns easily during pregnancy.  Drink plenty of fluids; avoid over heating.  Tanning Beds:  Because their possible side effects are still unknown, tanning beds are not recommended.  Ultrasound Scans:  Routine ultrasounds are performed at approximately 20 weeks.  You will be able to see your baby's general anatomy an if you would like to know the gender this can usually be determined as well.  If it is questionable when you conceived you may also receive an ultrasound early in your pregnancy for dating purposes.  Otherwise ultrasound exams are not routinely performed unless there is a medical necessity.  Although you can request a scan we ask that you pay for it when conducted because insurance does not cover " patient request" scans.  Work: If your pregnancy proceeds without complications you may work until your due date, unless your physician or employer advises otherwise.  Round Ligament Pain/Pelvic Discomfort:  Sharp, shooting pains not associated with bleeding are fairly common, usually occurring in the second trimester of pregnancy.  They tend to be worse when standing up or when you remain standing for long periods of time.  These are the result of pressure of certain pelvic ligaments called "round ligaments".  Rest, Tylenol and heat seem to be the most effective relief.  As the womb and fetus grow, they rise out of the pelvis and the discomfort improves.  Please notify the office if your pain seems different than that described.  It may represent a more serious condition.   

## 2020-12-20 NOTE — Progress Notes (Signed)
  HPI: Pt reports mild nausea; no pain or bleeding.  This is her first pregnancy.  Planning wedding in May now instead of Oct.  Concern for her achondroplasia.  Steffanie Rainwater does not have achondroplasia.  Ultrasound demonstrates early IUP w FHTs and measurements c/w LMP as far as EDC of 08/01/21  Beta on 12/17/20: [phone]  PMHx: She  has a past medical history of Dwarf, achondroplastic. Also,  has a past surgical history that includes Gastric bypass., family history is not on file.,  reports that she has never smoked. She has never used smokeless tobacco. She reports that she does not drink alcohol and does not use drugs.  She has a current medication list which includes the following prescription(s): cyclobenzaprine and ibuprofen. Also, is allergic to sulfa antibiotics.  Review of Systems  All other systems reviewed and are negative.   Objective: BP 120/80   Wt 143 lb (64.9 kg)   LMP 10/23/2020   BMI 40.22 kg/m   Physical examination Constitutional NAD, Conversant  Skin No rashes, lesions or ulceration.   Extremities: Moves all appropriately.  Normal ROM for age. No lymphadenopathy.  Neuro: Grossly intact  Psych: Oriented to PPT.  Normal mood. Normal affect.   US OB LESS THAN 14 WEEKS WITH OB TRANSVAGINAL  Result Date: 12/20/2020 Patient Name: Gina Webster DOB: 05-Nov-1986 MRN: [id number] ULTRASOUND REPORT Location: [facility] Date of Service: 12/20/2020 Indications:dating Findings: Mason Jim intrauterine pregnancy is visualized with a CRL consistent with [redacted]w[redacted]d gestation, giving an (U/S) EDD of 08/01/2021. The (U/S) EDD is consistent with the clinically established EDD of 08/02/2021. FHR: 159 BPM CRL measurement: 16.0 mm Yolk sac is visualized and appears normal. Amnion: visualized and appears normal Right Ovary is normal in appearance. Left Ovary is normal appearance. Corpus luteal cyst:  Left ovary Survey of the adnexa demonstrates no adnexal masses. There is no free peritoneal fluid  in the cul de sac. Impression: 1. [redacted]w[redacted]d Viable Singleton Intrauterine pregnancy by U/S. 2. (U/S) EDD is consistent with Clinically established EDD of 08/02/2021. Recommendations: 1.Clinical correlation with the patient's History and Physical Exam. Gina Webster, RT Review of ULTRASOUND.    I have personally reviewed images and report of recent ultrasound done at [facility].    Plan of management to be discussed with patient. Gina Major, MD, FACOG [facility], [facility] 12/20/2020  2:00 PM    Assessment:  Missed period [redacted] weeks gestation of pregnancy - newly diagnosed Achondroplasia  PNL and NIPT in 2 weeks  Pt is counseled : Pregnant women with achondroplasia will require cesarean section for delivery due to their small pelvic size. In addition, a woman with achondroplasia has a 50 percent chance of having an affected child who will have macrocephaly, another clear indication for a cesarean section.  A total of 20 minutes were spent face-to-face with the patient as well as preparation, review, communication, and documentation during this encounter.   Gina Major, MD, [facility], [facility] 12/20/2020  2:13 PM

## 2020-12-24 ENCOUNTER — Other Ambulatory Visit: Payer: Self-pay | Admitting: Obstetrics & Gynecology

## 2020-12-24 DIAGNOSIS — Z369 Encounter for antenatal screening, unspecified: Secondary | ICD-10-CM

## 2021-01-02 ENCOUNTER — Other Ambulatory Visit: Payer: Self-pay

## 2021-01-02 ENCOUNTER — Ambulatory Visit (INDEPENDENT_AMBULATORY_CARE_PROVIDER_SITE_OTHER): Payer: BC Managed Care – PPO | Admitting: Obstetrics & Gynecology

## 2021-01-02 VITALS — BP 120/80 | Wt 145.0 lb

## 2021-01-02 DIAGNOSIS — Z3A09 9 weeks gestation of pregnancy: Secondary | ICD-10-CM

## 2021-01-02 DIAGNOSIS — Z1379 Encounter for other screening for genetic and chromosomal anomalies: Secondary | ICD-10-CM

## 2021-01-02 DIAGNOSIS — Z369 Encounter for antenatal screening, unspecified: Secondary | ICD-10-CM

## 2021-01-02 DIAGNOSIS — O0991 Supervision of high risk pregnancy, unspecified, first trimester: Secondary | ICD-10-CM

## 2021-01-02 DIAGNOSIS — O0992 Supervision of high risk pregnancy, unspecified, second trimester: Secondary | ICD-10-CM | POA: Insufficient documentation

## 2021-01-02 NOTE — Progress Notes (Signed)
  Subjective  Fetal Movement? no Nausea? mild Leaking Fluid? no Vaginal Bleeding? no  Objective  BP 120/80   Wt 145 lb (65.8 kg)   LMP 10/23/2020   BMI 40.78 kg/m  General: NAD Pumonary: no increased work of breathing Abdomen: gravid, non-tender Extremities: no edema Psychiatric: mood appropriate, affect full  Assessment  35 y.o. G1P0000 at [redacted]w[redacted]d by  08/01/2021, by Ultrasound presenting for routine prenatal visit  Plan   Problem List Items Addressed This Visit    Visit Diagnoses    Encounter for genetic screening for Down Syndrome    -  Primary   Relevant Orders   MaterniT21 PLUS Core+SCA   Prenatal screening encounter       Relevant Orders   RPR+Rh+ABO+Rub Ab+Ab Scr+CB.Marland Kitchen.   [redacted] weeks gestation of pregnancy        Labs today, NIPT discussed PNV   Annamarie Major, MD, [facility], [facility] 01/02/2021  3:21 PM

## 2021-01-02 NOTE — Patient Instructions (Signed)
Genetic Testing During Pregnancy Why is genetic testing done? Genetic testing during pregnancy is also called prenatal genetic testing. This type of testing can determine if your baby is at risk of being born with a disorder caused by abnormal genes or chromosomes (genetic disorder). Chromosomes contain genes that control how your baby will develop in your womb. There are many different genetic disorders. Examples of genetic disorders that may be found through genetic testing include Down syndrome and cystic fibrosis. Gene changes (mutations) can be passed down through families. Genetic testing is offered to women during pregnancy. You can choose whether to have genetic testing. Having genetic testing allows you to:  Discuss your test results and options with your health care provider.  Prepare for a baby that may be born with a genetic disorder. Learning about the disorder ahead of time helps you be better prepared to manage it. Your health care providers can also be prepared in case your baby requires special care before or after birth.  Consider whether you want to continue with the pregnancy. In some cases, genetic testing may be done to learn about the traits a child will inherit. Types of genetic tests There are two basic types of genetic testing. Screening tests indicate whether your developing baby (fetus) is at higher risk for a genetic disorder. Diagnostic tests check actual fetal cells to diagnose a genetic disorder. Screening tests Screening tests will not harm your baby. They are recommended for all pregnant women. Types of screening tests include:  Carrier screening. This test involves checking genes from both parents by testing their blood or saliva. The test checks to find out if the parents carry a genetic mutation that may be passed to a baby. In most cases, both parents must carry the mutation for a baby to be at risk.  First trimester screening. This test combines a blood test  with sound wave imaging of your baby (fetal ultrasound). This screening test checks for a risk of Down syndrome or other defects caused by having extra chromosomes. The ultrasound also checks for defects of the heart, abdomen, or skeleton.  Second trimester screening also combines a blood test with a fetal ultrasound exam. This test checks for a risk of Down syndrome or other defects caused by having extra chromosomes. The ultrasound allows your health care provider to look for genetic defects of the face, brain, spine, heart, or limbs. Some women may choose to only have an ultrasound exam without a blood test.  Combined or sequential screening. This type of testing combines the results of first and second trimester screening. This type of testing may be more accurate than first or second trimester screening alone.  Cell-free DNA testing. This is a blood test that detects cells released by the placenta that get into the mother's blood. It can be used to check for a risk of Down syndrome, other extra chromosome syndromes, and disorders caused by abnormal numbers of sex chromosomes. This test can be done any time after 10 weeks of pregnancy.      Diagnostic tests Diagnostic tests carry slight risks of problems, including bleeding, infection, and loss of the pregnancy. These tests are done only if your baby is at risk for a genetic disorder. Your health care provider will discuss the risks and benefits of having diagnostic tests before performing these types of tests. Examples of diagnostic tests include:  Chorionic villus sampling (CVS). This involves a procedure to remove and test a sample of cells taken from the   placenta. The procedure may be done between 10 and 12 weeks of pregnancy.  Amniocentesis. This involves a procedure to remove and test a sample of fluid (amniotic fluid) and cells from the sac that surrounds the developing baby. The procedure may be done any time during the pregnancy, but it is  usually done between 15 and 20 weeks of pregnancy. What do the results mean? For a screening test:  If the results are negative, it often means that your child is not at higher risk. There is still a slight chance your child could have a genetic disorder.  If the results are positive, it does not mean your child will have a genetic disorder. It may mean that your child has a higher-than-normal risk for a genetic disorder. In that case, you should talk with your health care provider about whether you should have diagnostic genetic tests. For a diagnostic test:  If the result is negative, it is unlikely that your child will have a genetic disorder.  If the test is positive for a genetic disorder, it is likely that your child will have the disorder. The test may not tell how severe the disorder will be. Talk with your health care provider about your options. Talk with your health care provider about what your results mean. Questions to ask your health care provider Before talking to your health care provider about genetic testing, find out if there is a history of genetic disorders in your family. It may also help to know your family's ethnic origins. Then ask your health care provider the following questions:  Is my baby at risk for a genetic disorder?  What are the benefits of having genetic screening?  What tests are best for me and my baby?  What are the risks of each test?  If I get a positive result on a screening test, what is the next step?  Should I meet with a genetic counselor?  Should my partner or other members of my family be tested?  How much do the tests cost? Will my insurance cover the testing? Summary  Genetic testing is done during pregnancy to find out whether your child is at risk for a genetic disorder.  Genetic testing is offered to women during pregnancy. You can choose whether to have genetic testing.  There are two basic types of genetic testing. Screening  tests indicate whether your developing baby (fetus) is at higher risk for a genetic disorder. Diagnostic tests check actual fetal cells to diagnose a genetic disorder.  If a diagnostic genetic test is positive, talk with your health care provider about your options. This information is not intended to replace advice given to you by your health care provider. Make sure you discuss any questions you have with your health care provider. Document Revised: 05/18/2020 Document Reviewed: 05/18/2020 Elsevier Patient Education  2021 [employer]  

## 2021-01-03 LAB — RPR+RH+ABO+RUB AB+AB SCR+CB...
Antibody Screen: NEGATIVE
HIV Screen 4th Generation wRfx: NONREACTIVE
Hematocrit: 30 % — ABNORMAL LOW (ref 34.0–46.6)
Hemoglobin: 9.3 g/dL — ABNORMAL LOW (ref 11.1–15.9)
Hepatitis B Surface Ag: NEGATIVE
MCH: 21.7 pg — ABNORMAL LOW (ref 26.6–33.0)
MCHC: 31 g/dL — ABNORMAL LOW (ref 31.5–35.7)
MCV: 70 fL — ABNORMAL LOW (ref 79–97)
Platelets: 308 10*3/uL (ref 150–450)
RBC: 4.29 x10E6/uL (ref 3.77–5.28)
RDW: 16.8 % — ABNORMAL HIGH (ref 11.7–15.4)
RPR Ser Ql: NONREACTIVE
Rh Factor: POSITIVE
Rubella Antibodies, IGG: 1.08 index (ref >0.99)
Varicella zoster IgG: 1820 index (ref >165)
WBC: 6.7 10*3/uL (ref 3.4–10.8)

## 2021-01-04 ENCOUNTER — Telehealth: Payer: Self-pay

## 2021-01-04 NOTE — Telephone Encounter (Signed)
Pt calling; is 10w; has yeast inf; what to do?  [phone]  [facility]

## 2021-01-04 NOTE — Telephone Encounter (Signed)
Adv pt to use monistat 3d or 7d; if after completing the course she doesn't feel completely better to call and sched appt but do not use anything down there the night before appt.

## 2021-01-07 LAB — MATERNIT21 PLUS CORE+SCA
Fetal Fraction: 9
Monosomy X (Turner Syndrome): NOT DETECTED
Result (T21): NEGATIVE
Trisomy 13 (Patau syndrome): NEGATIVE
Trisomy 18 (Edwards syndrome): NEGATIVE
Trisomy 21 (Down syndrome): NEGATIVE
XXX (Triple X Syndrome): NOT DETECTED
XXY (Klinefelter Syndrome): NOT DETECTED
XYY (Jacobs Syndrome): NOT DETECTED

## 2021-01-08 ENCOUNTER — Telehealth: Payer: Self-pay

## 2021-01-08 NOTE — Telephone Encounter (Signed)
Pt states she sees Dr. Elby Beck in [place]; she will contact him and if needs hematology consult will let us know.

## 2021-01-08 NOTE — Telephone Encounter (Signed)
Yes, see who she has seen in past for this; otherwise we will need to get Hematology consult

## 2021-01-08 NOTE — Telephone Encounter (Signed)
Pt calling; saw low iron on her test results; has had gastric bypass 13yrs ago; low iron is d/t gastric bypass; does she need iron infusion or what to do?  [phone]

## 2021-01-09 NOTE — Telephone Encounter (Signed)
Pt calling; spoke c PH yesterday about seeing a hematologist; her primary MD would like for her to see a hematologist in the area.  [phone]

## 2021-01-10 ENCOUNTER — Other Ambulatory Visit: Payer: Self-pay | Admitting: Obstetrics & Gynecology

## 2021-01-10 DIAGNOSIS — D508 Other iron deficiency anemias: Secondary | ICD-10-CM

## 2021-01-10 NOTE — Telephone Encounter (Signed)
Pt aware ref sent in and will get a call either from [facility] or the hematologist's office.

## 2021-01-10 NOTE — Telephone Encounter (Signed)
Referral hematology placed; thx

## 2021-01-14 ENCOUNTER — Telehealth: Payer: Self-pay

## 2021-01-14 NOTE — Telephone Encounter (Signed)
Pt calling; is concerned about her iron levels; is 2wks out before she can get an iron infusion; is nervous about the baby; states too ridiculous  - had to call hematology dept four times before she got someone to answer the phone; is frustrated, scared, overwhelmed, upset this has taken this long.  [phone]

## 2021-01-18 ENCOUNTER — Inpatient Hospital Stay: Payer: BC Managed Care – PPO

## 2021-01-18 ENCOUNTER — Inpatient Hospital Stay: Payer: BC Managed Care – PPO | Attending: Oncology | Admitting: Oncology

## 2021-01-18 ENCOUNTER — Other Ambulatory Visit: Payer: Self-pay

## 2021-01-18 ENCOUNTER — Encounter: Payer: Self-pay | Admitting: Oncology

## 2021-01-18 VITALS — BP 106/69 | HR 88 | Temp 98.4°F | Resp 16 | Ht <= 58 in | Wt 145.0 lb

## 2021-01-18 DIAGNOSIS — D509 Iron deficiency anemia, unspecified: Secondary | ICD-10-CM

## 2021-01-18 DIAGNOSIS — D508 Other iron deficiency anemias: Secondary | ICD-10-CM | POA: Diagnosis present

## 2021-01-18 DIAGNOSIS — Z79899 Other long term (current) drug therapy: Secondary | ICD-10-CM | POA: Diagnosis not present

## 2021-01-18 DIAGNOSIS — O99011 Anemia complicating pregnancy, first trimester: Secondary | ICD-10-CM

## 2021-01-18 DIAGNOSIS — O99012 Anemia complicating pregnancy, second trimester: Secondary | ICD-10-CM

## 2021-01-18 LAB — CBC WITH DIFFERENTIAL/PLATELET
Abs Immature Granulocytes: 0.02 10*3/uL (ref 0.00–0.07)
Basophils Absolute: 0 10*3/uL (ref 0.0–0.1)
Basophils Relative: 1 %
Eosinophils Absolute: 0.1 10*3/uL (ref 0.0–0.5)
Eosinophils Relative: 1 %
HCT: 30.7 % — ABNORMAL LOW (ref 36.0–46.0)
Hemoglobin: 9.3 g/dL — ABNORMAL LOW (ref 12.0–15.0)
Immature Granulocytes: 0 %
Lymphocytes Relative: 34 %
Lymphs Abs: 2.2 10*3/uL (ref 0.7–4.0)
MCH: 21.5 pg — ABNORMAL LOW (ref 26.0–34.0)
MCHC: 30.3 g/dL (ref 30.0–36.0)
MCV: 71.1 fL — ABNORMAL LOW (ref 80.0–100.0)
Monocytes Absolute: 0.4 10*3/uL (ref 0.1–1.0)
Monocytes Relative: 7 %
Neutro Abs: 3.7 10*3/uL (ref 1.7–7.7)
Neutrophils Relative %: 57 %
Platelets: 300 10*3/uL (ref 150–400)
RBC: 4.32 MIL/uL (ref 3.87–5.11)
RDW: 18.7 % — ABNORMAL HIGH (ref 11.5–15.5)
WBC: 6.5 10*3/uL (ref 4.0–10.5)
nRBC: 0 % (ref 0.0–0.2)

## 2021-01-18 LAB — IRON AND TIBC
Iron: 83 ug/dL (ref 28–170)
Saturation Ratios: 18 % (ref 10.4–31.8)
TIBC: 466 ug/dL — ABNORMAL HIGH (ref 250–450)
UIBC: 383 ug/dL

## 2021-01-18 LAB — VITAMIN B12: Vitamin B-12: 323 pg/mL (ref 180–914)

## 2021-01-18 LAB — TSH: TSH: 1.751 u[IU]/mL (ref 0.350–4.500)

## 2021-01-18 LAB — FERRITIN: Ferritin: 5 ng/mL — ABNORMAL LOW (ref 11–307)

## 2021-01-18 LAB — FOLATE: Folate: 32 ng/mL (ref >5.9)

## 2021-01-18 NOTE — Progress Notes (Signed)
Pt is 3 months pregant and taking ferrous sulfate and pre natal vit. But she was told she needs iro infusion. She has iron tx before and it was around 2017.

## 2021-01-20 ENCOUNTER — Encounter: Payer: Self-pay | Admitting: Oncology

## 2021-01-20 DIAGNOSIS — O99011 Anemia complicating pregnancy, first trimester: Secondary | ICD-10-CM | POA: Insufficient documentation

## 2021-01-20 NOTE — Progress Notes (Signed)
Hematology/Oncology Consult note [facility] Telephone:([phone] Fax:([phone]) [phone]  Patient Care Team: Patient, No Pcp Per as PCP - General (General Practice)   Name of the patient: Gina Webster  [id number]  1986-03-13    Reason for referral- iron deficiency anemia   Referring physician- Dr. Effie Shy  Date of visit: 01/20/21   History of presenting illness- Patient is a 35 year old female with a past medical history significant for dwarfism, achondroplasia referred for microcytic anemia in the first trimester of pregnancy.  Most recent CBC from 01/02/2021 showed white cell count of 6.7, H&H of 9.3/30 with an MCV of 70 and a platelet count of 308.  I do not have any prior counts for comparison.  No recent iron studies have been checked.  Patient reports mild fatigue but denies other complaints at this time.  This is her first pregnancy  ECOG PS- 0  Pain scale- 0   Review of systems- Review of Systems  Constitutional: Positive for malaise/fatigue. Negative for chills, fever and weight loss.  HENT: Negative for congestion, ear discharge and nosebleeds.   Eyes: Negative for blurred vision.  Respiratory: Negative for cough, hemoptysis, sputum production, shortness of breath and wheezing.   Cardiovascular: Negative for chest pain, palpitations, orthopnea and claudication.  Gastrointestinal: Negative for abdominal pain, blood in stool, constipation, diarrhea, heartburn, melena, nausea and vomiting.  Genitourinary: Negative for dysuria, flank pain, frequency, hematuria and urgency.  Musculoskeletal: Negative for back pain, joint pain and myalgias.  Skin: Negative for rash.  Neurological: Negative for dizziness, tingling, focal weakness, seizures, weakness and headaches.  Endo/Heme/Allergies: Does not bruise/bleed easily.  Psychiatric/Behavioral: Negative for depression and suicidal ideas. The patient does not have insomnia.     Allergies  Allergen  Reactions  . Sulfa Antibiotics Rash    Patient Active Problem List   Diagnosis Date Noted  . Iron deficiency anemia secondary to inadequate dietary iron intake 01/10/2021  . High-risk pregnancy, first trimester 01/02/2021  . Achondroplasia 12/20/2020  . 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 Dyspareunia due to medical condition in female 04/19/2018     Past Medical History:  Diagnosis Date  . Anemia   . Dwarf, achondroplastic      Past Surgical History:  Procedure Laterality Date  . GASTRIC BYPASS      Social History   Socioeconomic History  . Marital status: Single    Spouse name: Not on file  . Number of children: Not on file  . Years of education: Not on file  . Highest education level: Not on file  Occupational History  . Not on file  Tobacco Use  . Smoking status: Never Smoker  . Smokeless tobacco: Never Used  Vaping Use  . Vaping Use: Never used  Substance and Sexual Activity  . Alcohol use: No  . Drug use: Never  . Sexual activity: Yes    Birth control/protection: None  Other Topics Concern  . Not on file  Social History Narrative  . Not on file   Social Determinants of Health   Financial Resource Strain: Not on file  Food Insecurity: Not on file  Transportation Needs: Not on file  Physical Activity: Not on file  Stress: Not on file  Social Connections: Not on file  Intimate Partner Violence: Not on file     Family History  Problem Relation Age of Onset  . Heart disease Father      Current Outpatient Medications:  .  ferrous sulfate 325 (65 FE) MG tablet,  Take 325 mg by mouth daily with breakfast., Disp: , Rfl:  .  prenatal vitamin w/FE, FA (NATACHEW) 29-1 MG CHEW chewable tablet, Chew 2 tablets by mouth daily at 12 noon., Disp: , Rfl:    Physical exam:  Vitals:   01/18/21 1348  BP: 106/69  Pulse: 88  Resp: 16  Temp: 98.4 F (36.9 C)  TempSrc: Oral  Weight: 145 lb (65.8 kg)  Height: 4\' 2"  (1.27 m)   Physical Exam Constitutional:       General: She is not in acute distress. Cardiovascular:     Rate and Rhythm: Normal rate and regular rhythm.     Heart sounds: Normal heart sounds.  Pulmonary:     Effort: Pulmonary effort is normal.     Breath sounds: Normal breath sounds.  Abdominal:     General: Bowel sounds are normal.     Palpations: Abdomen is soft.  Skin:    General: Skin is warm and dry.  Neurological:     Mental Status: She is alert and oriented to person, place, and time.        No flowsheet data found. CBC Latest Ref Rng & Units 01/18/2021  WBC 4.0 - 10.5 K/uL 6.5  Hemoglobin 12.0 - 15.0 g/dL 03/20/2021)  Hematocrit 7.7(O - 46.0 % 30.7(L)  Platelets 150 - 400 K/uL 300    Assessment and plan- Patient is a 35 y.o. female referred for microcytic anemia in the first trimester of pregnancy  Patient's most recent hemoglobin showed evidence of microcytosis with a hemoglobin of 9.3 suggestive of iron deficiency.  Today I will check her ferritin and iron studies B12 and folate as well as TSH.  We will have to wait for at least 2 weeks for her to get past 13 weeks/first trimester before giving her IV iron.  Discussed risks and benefits of IV iron including all but not limited to headache, possible risk of infusion reaction.  I recommend 5 doses of Venofer 200 mg IV given over 3 weeks starting after 2 weeks.  I will tentatively see her back in 8 weeks with CBC ferritin and iron studies  Etiology of iron deficiency anemia possibly secondary to pregnancy and increased demands.  We will follow postpartum as well   Thank you for this kind referral and the opportunity to participate in the care of this patient   Visit Diagnosis 1. Anemia affecting pregnancy in second trimester   2. Iron deficiency anemia, unspecified iron deficiency anemia type     Dr. 31, MD, MPH [facility] 01/20/2021  3:27 PM

## 2021-01-22 ENCOUNTER — Other Ambulatory Visit: Payer: Self-pay

## 2021-01-22 ENCOUNTER — Ambulatory Visit: Payer: BC Managed Care – PPO | Attending: Maternal & Fetal Medicine

## 2021-01-22 ENCOUNTER — Ambulatory Visit (HOSPITAL_BASED_OUTPATIENT_CLINIC_OR_DEPARTMENT_OTHER): Payer: BC Managed Care – PPO

## 2021-01-22 DIAGNOSIS — O09511 Supervision of elderly primigravida, first trimester: Secondary | ICD-10-CM

## 2021-01-22 DIAGNOSIS — Z3A12 12 weeks gestation of pregnancy: Secondary | ICD-10-CM

## 2021-01-22 DIAGNOSIS — O99011 Anemia complicating pregnancy, first trimester: Secondary | ICD-10-CM | POA: Diagnosis not present

## 2021-01-22 DIAGNOSIS — O338 Maternal care for disproportion of other origin: Secondary | ICD-10-CM

## 2021-01-22 DIAGNOSIS — O09521 Supervision of elderly multigravida, first trimester: Secondary | ICD-10-CM | POA: Insufficient documentation

## 2021-01-22 DIAGNOSIS — O99891 Other specified diseases and conditions complicating pregnancy: Secondary | ICD-10-CM

## 2021-01-22 DIAGNOSIS — Z369 Encounter for antenatal screening, unspecified: Secondary | ICD-10-CM

## 2021-01-22 DIAGNOSIS — Q774 Achondroplasia: Secondary | ICD-10-CM

## 2021-01-22 NOTE — Progress Notes (Addendum)
Referring Provider:   Teresa Coombs, [facility] Length of Consultation: 30 minutes  Ms. Hopwood was referred to Maternal Fetal Care at [facility] for genetic counseling because of advanced maternal age and a personal history of Achondroplasia.  The patient will be 35 years old at the time of delivery.  This note summarizes the information we discussed.    We explained that the chance of a chromosome abnormality increases with maternal age.  Chromosomes and examples of chromosome problems were reviewed.  Humans typically have 46 chromosomes in each cell, with half passed through each sperm and egg.  Any change in the number or structure of chromosomes can increase the risk of problems in the physical and mental development of a pregnancy.   Based upon age of the patient and the current gestational age, the chance of any chromosome abnormality was 1 in 34. The chance of Down syndrome, the most common chromosome problem associated with maternal age, was 1 in 33.  The risk of chromosome problems is in addition to the 3% general population risk for birth defects and intellectual disabilities.  The greatest chance, of course, is that the baby would be born in good health.  We discussed the following prenatal screening and testing options for this pregnancy:  Cell free fetal DNA testing analyzes maternal blood to determine whether or not the baby may have Down syndrome, trisomy 36, or trisomy 75.  This test utilizes a maternal blood sample and DNA sequencing technology to isolate circulating cell free fetal DNA from maternal plasma.  The fetal DNA can then be analyzed for DNA sequences that are derived from the three most common chromosomes involved in aneuploidy, chromosomes 13, 18, and 21.  If the overall amount of DNA is greater than the expected level for any of these chromosomes, aneuploidy is suspected.  While we do not consider it a replacement for invasive testing and karyotype analysis, a  negative result from this testing would be reassuring, though not a guarantee of a normal chromosome complement for the baby.  An abnormal result is certainly suggestive of an abnormal chromosome complement, though we would still recommend CVS or amniocentesis to confirm any findings from this testing.  Ms. Tovey had prior cell free fetal DNA testing in this pregnancy which was negative for Trisomy 13, 18 and 21.  Results were also low risk for sex chromosome aneuploidies and revealed the fetal gender to be female.  This result significantly reduces the chance for these conditions in the pregnancy, but is still considered a screening test.   The chorionic villus sampling procedure is available for first trimester chromosome analysis.  This involves the withdrawal of a small amount of chorionic villi (tissue from the developing placenta).  Risk of pregnancy loss is estimated to be approximately 1 in 200 to 1 in 100 (0.5 to 1%).  There is approximately a 1% (1 in 100) chance that the CVS chromosome results will be unclear.  Chorionic villi cannot be tested for neural tube defects.     Maternal serum marker screening, a blood test that measures pregnancy proteins, can provide risk assessments for Down syndrome, trisomy 18, and open neural tube defects (spina bifida, anencephaly). Because it does not directly examine the fetus, it cannot positively diagnose or rule out these problems. The detection rate is approximately 75% for Down syndrome, 70% for trisomy 18 and 80% of open neural tube defects. If prior chromosome screening has been performed, then AFP only is recommended to test  for open neural tube defects alone.  Targeted ultrasound uses high frequency sound waves to create an image of the developing fetus.  An ultrasound is often recommended as a routine means of evaluating the pregnancy.  It is also used to screen for fetal anatomy problems (for example, a heart defect) that might be suggestive of a  chromosomal or other abnormality.   Amniocentesis involves the removal of a small amount of amniotic fluid from the sac surrounding the fetus with the use of a thin needle inserted through the maternal abdomen and uterus.  Ultrasound guidance is used throughout the procedure.  Fetal cells from amniotic fluid are directly evaluated and > 99.5% of chromosome problems and > 98% of open neural tube defects can be detected. This procedure is generally performed after the 15th week of pregnancy.  The main risks to this procedure include complications leading to miscarriage in less than 1 in 200 cases (0.5%).  Cystic Fibrosis and Spinal Muscular Atrophy (SMA) screening were also discussed with the patient. Both conditions are recessive, which means that both parents must be carriers in order to have a child with the disease.  Cystic fibrosis (CF) is one of the most common genetic conditions in persons of Caucasian ancestry.  This condition occurs in approximately 1 in 2,500 Caucasian persons and results in thickened secretions in the lungs, digestive, and reproductive systems.  For a baby to be at risk for having CF, both of the parents must be carriers for this condition.  Approximately 1 in 19 Caucasian persons is a carrier for CF.  Current carrier testing looks for the most common mutations in the gene for CF and can detect approximately 90% of carriers in the Caucasian population.  This means that the carrier screening can greatly reduce, but cannot eliminate, the chance for an individual to have a child with CF.  If an individual is found to be a carrier for CF, then carrier testing would be available for the partner. As part of [place] newborn screening profile, all babies born in the state of [place] will have a two-tier screening process.  Specimens are first tested to determine the concentration of immunoreactive trypsinogen (IRT).  The top 5% of specimens with the highest IRT values then  undergo DNA testing using a panel of over 40 common CF mutations. SMA is a neurodegenerative disorder that leads to atrophy of skeletal muscle and overall weakness.  This condition is also more prevalent in the Caucasian population, with 1 in 40-1 in 60 persons being a carrier and 1 in 6,000-1 in 10,000 children being affected.  There are multiple forms of the disease, with some causing death in infancy to other forms with survival into adulthood.  The genetics of SMA is complex, but carrier screening can detect up to 95% of carriers in the Caucasian population.  Similar to CF, a negative result can greatly reduce, but cannot eliminate, the chance to have a child with SMA.  Hemoglobinopathy screening was also offered to the patient.  We obtained a detailed family history and pregnancy history.  Ms. 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  She has not had any genetic testing.  There are no other individuals in her family who are little people.  The father of the baby, Marya Amsler, reported that he was born with proximal femoral focal deficiency (PFFD).  No other other family members have this condition.  The remainder of the family history is unremarkable for birth  defects, developmental delays, recurrent pregnancy loss or known chromosome abnormalities. We discussed the following as it relates to this pregnancy:  Achondroplasia: Achondroplasia is the most common form of skeletal dysplasia.  It caused by a change in the FGFR3 gene. In approximately 80% of cases, this condition occurs as a de novo (or new change) in the affected child, with both parents being of typical stature. Individuals with Achondroplasia often present in newborn or infant period with rhizomelic shortening of the limbs, macrocephaly, and characteristic facial features with frontal bossing and midface hypoplasia. Most individuals with this condition have normal intelligence and lifespan, though many babies will have low  muscle tone which may result in delayed motor milestones. Other complications, such as craniocervical junction compression, obstructive sleep apnea, middle ear dysfunction, kyphosis, and spinal stenosis can also be more common among individuals with Achondroplasia.   When a parent has Achondroplasia, there is a 50% chance with each pregnancy for the baby to inherit the changed gene and also have the condition.  There is a 50% chance that the baby would be of typical stature.  In families where both parents have Achondroplasia, there is a 25% chance for the baby to have normal stature, a 50% chance for Achondroplasia, and a 25% chance for the baby to inherit two changed copies of the FGFR3 gene which would be expect to result in miscarriage.  If one parent has Achondroplasia and the other parent is also a little person due to a different genetic cause, then these risks will be different depending upon the exact cause.  If the gene change is known in a parent, then genetic testing on the pregnancy is available.  When the mother has the gene change, diagnostic testing via CVS or amniocentesis is available. We discussed the technical aspects of the amniocentesis procedure and quoted up to a 1 in 500 (0.2%) risk for spontaneous pregnancy loss or other adverse pregnancy outcomes as a result of the procedure. Cultured cells from an amniocentesis sample allow for the visualization of a fetal karyotype, which can detect >99% of chromosomal conditions. Chromosomal microarray can also be performed to identify smaller deletions or duplications of fetal chromosomal material. Testing for Achondroplasia or other skeletal dysplasias can be included in this testing as appropriate.  Ultrasound can also be used to assess for features of Achondroplasia or other skeletal dysplasias in pregnancy. Features such as short long bones, head shape, small chest size, trident hand and facial differences may be useful in the second or third  trimesters.  It is important to remember that the long bones lengths of pregnancies with Achondroplasia and those without may have a lot of overlap.  Therefore, not all babies with this condition would be expected to be identified with ultrasound prior to birth.  Postnatal evaluation would be appropriate for babies at risk for Achondroplasia. We would recommend a medical genetics evaluation if needed after delivery.  Ms. Baillie also had an MFM consultation due to her diagnosis of Achondroplasia.  Women with this condition are an increased risk for complications with anesthesia and typically will require a C-section due to small pelvic size.  See that consultation for additional recommendations.  Proximal Femoral Focal Deficiency: The father of the baby reported that he was born with proximal femoral focal deficiency(PFFD).  This rare condition occurs in 1-2 per 100,000 live births.  It is most often unilateral and happens more commonly in females than males (2:1).  PFFD is highly variable and can range from hypoplastic  short femur to complete femoral agenesis.  While it is most often sporadic, it can be associated with some genetic syndromes as well as some environmental risk factors including hypoxia, maternal diabetes, ischemia and thalidomide.  The genetic syndromes associated with this condition often have other features such as polydactyly, syndactyly, facial differences and cleft lip or palate.  Marya Amsler did not report any of these features and stated that he was previously told his condition was not inherited.  We would expect the recurrence risk for his children to be low.     Ms. Tahir reported no complications in this pregnancy.  She reported no exposure to medications, alcohol, tobacco or recreational drugs.  She is a [place]. This is the first pregnancy for she and Marya Amsler.  After consideration of the options, Ms. Picker elected to proceed with an ultrasound and to decline invasive  prenatal diagnosis for Achondroplasia.  They also declined carrier screening for CF and SMA.    An ultrasound was performed at the time of the visit.  The gestational age was consistent with 12 weeks.  Fetal anatomy could not be assessed due to early gestational age.  Please refer to the ultrasound report for details of that study and the MFM recommendations.  Ms. Kaczmarek was encouraged to call with questions or concerns.  We can be contacted at [phone].  Plan of Care: Marland Kitchen Scheduled to return to clinic in 6-8 weeks for fetal anatomy ultrasound . msAFP only to be drawn by OB if desired for spina bifida screening  . Ms. Hobson declines invasive prenatal diagnosis for Achondroplasia at this time   Wilburt Finlay, MS, CGC

## 2021-01-30 ENCOUNTER — Ambulatory Visit (INDEPENDENT_AMBULATORY_CARE_PROVIDER_SITE_OTHER): Payer: BC Managed Care – PPO | Admitting: Obstetrics & Gynecology

## 2021-01-30 ENCOUNTER — Encounter: Payer: Self-pay | Admitting: Obstetrics & Gynecology

## 2021-01-30 ENCOUNTER — Other Ambulatory Visit: Payer: Self-pay

## 2021-01-30 VITALS — BP 118/80 | Wt 147.0 lb

## 2021-01-30 DIAGNOSIS — O0991 Supervision of high risk pregnancy, unspecified, first trimester: Secondary | ICD-10-CM

## 2021-01-30 DIAGNOSIS — Q774 Achondroplasia: Secondary | ICD-10-CM

## 2021-01-30 DIAGNOSIS — Z3A13 13 weeks gestation of pregnancy: Secondary | ICD-10-CM

## 2021-01-30 DIAGNOSIS — Z9884 Bariatric surgery status: Secondary | ICD-10-CM

## 2021-01-30 NOTE — Progress Notes (Signed)
  Subjective  No nausea or pain or bleeding Genetics and MFM appointments done, pt reassured  Objective  BP 118/80   Wt 147 lb (66.7 kg)   LMP 10/23/2020   BMI 41.34 kg/m  General: NAD Pumonary: no increased work of breathing Abdomen: gravid, non-tender Extremities: no edema Psychiatric: mood appropriate, affect full  UA NEG  Assessment  35 y.o. G1P0000 at [redacted]w[redacted]d by  08/01/2021, by Ultrasound presenting for routine prenatal visit  Plan   Problem List Items Addressed This Visit    Achondroplasia    MFM on board    Monthly [place] for grwoth    CS at term   Relevant Orders   Ambulatory referral to Anesthesiology   High-risk pregnancy, first trimester   History of bariatric surgery    Labs each trimester    Growth [place]   [redacted] weeks gestation of pregnancy    PNV       Problem Noted Resolved   History of bariatric surgery     Overview Addendum 01/30/2021  3:29 PM by Nadara Mustard, MD    Discussed that a prenatal vitamin should be taken daily as well  as 60 g of protein per day and a balanced diet with an  additional 1200 mg/day of calcium citrate.  Also, we usually  recommend that serial laboratory testing be obtained each  trimester, which should consist of serum albumin, total  unionized calcium, parathyroid hormone, complete blood  count, folic acid, serum iron ferritin, and total iron binding  capacity. Levels of fat-soluble vitamins (A, D, E, and PTT as  a surigate for vitamin K) as well as B12 and 25 hydroxy  vitamin D should be checked every trimester as well.  Initial  labs demonstrated iron deficiency anemia. She is scheduled  for iron infusion in the coming week.  With respect to antenatal surveillance, reports are somewhat  conflicting with regard to infant birth weight and should be  interpreted with caution.  However, in general there is a trend  for more small for gestational age infants in those  pregnancies after bariatric surgery.  Therefore, serial  ultrasounds are recommended.       [facility]   Dating LMP and [place] 7 wks   Genetic Screen      NIPS:nml XX   Anatomic [place] At MFM   GTT  Third trimester:    Rhogam    TDaP vaccine             Flu Shot:   [employer]                                  Contraception  Pap:10/2020  CBB  NO   CS/VBAC N/A   Support Person Tera Mater, MD, [facility], [facility] 01/30/2021  3:52 PM

## 2021-01-30 NOTE — Patient Instructions (Signed)

## 2021-01-31 ENCOUNTER — Ambulatory Visit: Payer: BC Managed Care – PPO

## 2021-01-31 ENCOUNTER — Inpatient Hospital Stay: Payer: BC Managed Care – PPO

## 2021-01-31 VITALS — BP 114/45 | HR 78 | Temp 97.5°F | Resp 20

## 2021-01-31 DIAGNOSIS — O99011 Anemia complicating pregnancy, first trimester: Secondary | ICD-10-CM

## 2021-01-31 DIAGNOSIS — O99012 Anemia complicating pregnancy, second trimester: Secondary | ICD-10-CM | POA: Diagnosis not present

## 2021-01-31 MED ORDER — SODIUM CHLORIDE 0.9 % IV SOLN: 200.0000 mg | INTRAVENOUS | Status: DC

## 2021-01-31 MED ORDER — IRON SUCROSE 20 MG/ML IV SOLN
200.0000 mg | Freq: Once | INTRAVENOUS | Status: AC
  Administered 2021-01-31: 200 mg via INTRAVENOUS
  Filled 2021-01-31: qty 10

## 2021-01-31 MED ORDER — SODIUM CHLORIDE 0.9 % IV SOLN
Freq: Once | INTRAVENOUS | Status: AC
  Filled 2021-01-31: qty 250

## 2021-02-04 ENCOUNTER — Inpatient Hospital Stay: Payer: BC Managed Care – PPO

## 2021-02-04 VITALS — BP 111/64 | HR 71 | Temp 98.0°F | Resp 18

## 2021-02-04 DIAGNOSIS — O99011 Anemia complicating pregnancy, first trimester: Secondary | ICD-10-CM

## 2021-02-04 DIAGNOSIS — O99012 Anemia complicating pregnancy, second trimester: Secondary | ICD-10-CM | POA: Diagnosis not present

## 2021-02-04 MED ORDER — IRON SUCROSE 20 MG/ML IV SOLN
200.0000 mg | Freq: Once | INTRAVENOUS | Status: AC
  Administered 2021-02-04: 200 mg via INTRAVENOUS
  Filled 2021-02-04: qty 10

## 2021-02-04 MED ORDER — SODIUM CHLORIDE 0.9 % IV SOLN: 200.0000 mg | INTRAVENOUS | Status: DC

## 2021-02-04 MED ORDER — SODIUM CHLORIDE 0.9 % IV SOLN
Freq: Once | INTRAVENOUS | Status: AC
  Filled 2021-02-04: qty 250

## 2021-02-06 ENCOUNTER — Inpatient Hospital Stay: Payer: BC Managed Care – PPO

## 2021-02-06 ENCOUNTER — Other Ambulatory Visit: Payer: Self-pay

## 2021-02-06 VITALS — BP 117/64 | HR 6 | Temp 97.1°F | Resp 18

## 2021-02-06 DIAGNOSIS — O99012 Anemia complicating pregnancy, second trimester: Secondary | ICD-10-CM | POA: Diagnosis not present

## 2021-02-06 DIAGNOSIS — O99011 Anemia complicating pregnancy, first trimester: Secondary | ICD-10-CM

## 2021-02-06 MED ORDER — IRON SUCROSE 20 MG/ML IV SOLN
200.0000 mg | Freq: Once | INTRAVENOUS | Status: AC
  Administered 2021-02-06: 200 mg via INTRAVENOUS
  Filled 2021-02-06: qty 10

## 2021-02-06 MED ORDER — SODIUM CHLORIDE 0.9 % IV SOLN
Freq: Once | INTRAVENOUS | Status: AC
  Filled 2021-02-06: qty 250

## 2021-02-06 MED ORDER — SODIUM CHLORIDE 0.9 % IV SOLN: 200.0000 mg | INTRAVENOUS | Status: DC

## 2021-02-08 ENCOUNTER — Inpatient Hospital Stay: Payer: BC Managed Care – PPO | Attending: Oncology

## 2021-02-08 ENCOUNTER — Other Ambulatory Visit: Payer: Self-pay

## 2021-02-08 VITALS — BP 112/61 | HR 90 | Temp 97.8°F | Resp 18

## 2021-02-08 DIAGNOSIS — D508 Other iron deficiency anemias: Secondary | ICD-10-CM | POA: Diagnosis not present

## 2021-02-08 DIAGNOSIS — O99012 Anemia complicating pregnancy, second trimester: Secondary | ICD-10-CM | POA: Diagnosis present

## 2021-02-08 DIAGNOSIS — O99011 Anemia complicating pregnancy, first trimester: Secondary | ICD-10-CM

## 2021-02-08 MED ORDER — IRON SUCROSE 20 MG/ML IV SOLN
200.0000 mg | Freq: Once | INTRAVENOUS | Status: AC
  Administered 2021-02-08: 200 mg via INTRAVENOUS
  Filled 2021-02-08: qty 10

## 2021-02-08 MED ORDER — SODIUM CHLORIDE 0.9 % IV SOLN: 200.0000 mg | INTRAVENOUS | Status: DC

## 2021-02-08 MED ORDER — SODIUM CHLORIDE 0.9 % IV SOLN
Freq: Once | INTRAVENOUS | Status: AC
  Filled 2021-02-08: qty 250

## 2021-02-11 ENCOUNTER — Inpatient Hospital Stay: Payer: BC Managed Care – PPO

## 2021-02-11 ENCOUNTER — Other Ambulatory Visit: Payer: Self-pay

## 2021-02-11 VITALS — BP 111/69 | HR 80 | Temp 97.4°F | Resp 18

## 2021-02-11 DIAGNOSIS — O99011 Anemia complicating pregnancy, first trimester: Secondary | ICD-10-CM

## 2021-02-11 DIAGNOSIS — D508 Other iron deficiency anemias: Secondary | ICD-10-CM | POA: Diagnosis not present

## 2021-02-11 MED ORDER — SODIUM CHLORIDE 0.9 % IV SOLN: 200.0000 mg | INTRAVENOUS | Status: DC

## 2021-02-11 MED ORDER — SODIUM CHLORIDE 0.9 % IV SOLN
Freq: Once | INTRAVENOUS | Status: AC
  Filled 2021-02-11: qty 250

## 2021-02-11 MED ORDER — IRON SUCROSE 20 MG/ML IV SOLN
200.0000 mg | Freq: Once | INTRAVENOUS | Status: AC
  Administered 2021-02-11: 200 mg via INTRAVENOUS
  Filled 2021-02-11: qty 10

## 2021-02-25 ENCOUNTER — Telehealth: Payer: Self-pay

## 2021-02-25 NOTE — Telephone Encounter (Signed)
Pt called after hour nurse 02/22/21 11:02am c/o allergy issues; what to take being 97m preg; stuffy nose; headache;  After hour nurse adv claritin or zyrtec. [phone] Left detailed msg may also take e.s. tylenol for h/a; two q6h while awake.

## 2021-03-04 ENCOUNTER — Other Ambulatory Visit: Payer: Self-pay | Admitting: *Deleted

## 2021-03-04 ENCOUNTER — Other Ambulatory Visit: Payer: Self-pay | Admitting: Obstetrics & Gynecology

## 2021-03-04 DIAGNOSIS — O99012 Anemia complicating pregnancy, second trimester: Secondary | ICD-10-CM

## 2021-03-04 DIAGNOSIS — Q774 Achondroplasia: Secondary | ICD-10-CM

## 2021-03-04 DIAGNOSIS — O09522 Supervision of elderly multigravida, second trimester: Secondary | ICD-10-CM

## 2021-03-05 ENCOUNTER — Ambulatory Visit (INDEPENDENT_AMBULATORY_CARE_PROVIDER_SITE_OTHER): Payer: BC Managed Care – PPO | Admitting: Obstetrics & Gynecology

## 2021-03-05 ENCOUNTER — Encounter: Payer: Self-pay | Admitting: Obstetrics & Gynecology

## 2021-03-05 ENCOUNTER — Other Ambulatory Visit: Payer: Self-pay

## 2021-03-05 VITALS — BP 120/80 | Wt 148.0 lb

## 2021-03-05 DIAGNOSIS — Z9884 Bariatric surgery status: Secondary | ICD-10-CM

## 2021-03-05 DIAGNOSIS — O0992 Supervision of high risk pregnancy, unspecified, second trimester: Secondary | ICD-10-CM

## 2021-03-05 DIAGNOSIS — Z3A18 18 weeks gestation of pregnancy: Secondary | ICD-10-CM

## 2021-03-05 LAB — POCT URINALYSIS DIPSTICK OB
Glucose, UA: NEGATIVE
POC,PROTEIN,UA: NEGATIVE

## 2021-03-05 NOTE — Patient Instructions (Signed)
Prenatal Ultrasound A prenatal ultrasound, also called a sonogram, is an imaging test that allows your health care provider to see your baby and placenta in the uterus. This is a safe and painless test that does not expose you or your baby to any X-rays, needles, or medicines. Prenatal ultrasounds are done using a handheld device called a transducer. The transducer sends out sound waves (ultrasound) that reflect off of your baby's bones and other tissues to create images on a computer screen. You may have other ultrasounds as needed at any point during your pregnancy. If your health care provider suspects a problem, you may have a more detailed type of ultrasound called advanced ultrasound. Many insurance plans only cover ultrasounds that are needed for medical care. Check with your health care provider and insurance company to determine if the ultrasound is covered by insurance, or if there will be out of pocket costs to you. Types of prenatal ultrasound There are two types of prenatal ultrasound:  Transabdominal ultrasound. During this test, a transducer is placed on your belly and moved around. A routine transabdominal ultrasound is usually done between weeks 18 and 22 of pregnancy. It may also be done between weeks 13 and 14.  Transvaginal ultrasound. During this test, a transducer that is shaped like a wand is placed inside your vagina. This type of ultrasound is usually done during early pregnancy. What are the benefits of prenatal ultrasound? Prenatal ultrasounds may be used to check:  How far along your pregnancy is and if you are carrying more than one baby.  Your baby's approximate size, weight, and development (gestational age).  The location and condition of the placenta. The placenta supplies your baby with nutrition and oxygen.  Your baby's heart rate, position, and movements.  The amount of fluid surrounding your baby (amniotic fluid).  Your baby's sex, if your baby is in a  position that allows the sex organs to be seen, and if you choose to learn the sex at this time.  If there are any possible problems that require more testing, such as genetic problems.  If your pregnancy is forming outside your uterus (ectopic pregnancy). What are the risks? Generally, this is a safe test. There are no known risks for you or your baby from a prenatal ultrasound. What happens before the test?  Before a transabdominal ultrasound, you may be asked to drink fluid 2 hours before the exam and avoid emptying your bladder. A full bladder helps the images show up more clearly.  Before a transvaginal ultrasound, you may be asked to empty your bladder.  Wear loose, comfortable clothing so it is easy to undress or expose your lower belly for the exam. What happens during the test? If you are having a transabdominal ultrasound:  You will lie on an exam table.  Your belly will be exposed.  Gel will be rubbed over your belly.  The transducer will be pressed on your belly and moved back and forth, through the gel. You may feel slight pressure, but there should not be any pain.  You may be asked to change your position.  You may hear sounds of blood flow and your baby's heartbeat. You may be able to see images of your baby on the computer screen. Your health care provider may measure your baby's head and other body parts, looking for normal development.  After the exam, the gel will be cleaned off, and you can replace your clothing. You will be able to empty   your bladder after the exam is done. If you are having a transvaginal ultrasound:  You will change into a hospital gown or undress from the waist down and cover yourself with a paper sheet.  You will lie down on an exam table with your feet in footrests (stirrups).  The transducer will be covered with a protective cover and lubricated.  The transducer will be inserted into your vagina.  You may hear sounds of blood flow  and your baby's heartbeat. You may be able to see images of your baby on the computer screen.  After the exam, the transducer will be removed, and you can put your clothes back on.   What can I expect after the test?  You can drive yourself home and return to all your normal activities.  Your ultrasound images will be reviewed and a report will be sent to your doctor or midwife.  It is up to you to get your test results. Ask your health care provider, or the department that is doing the test, when your results will be ready. Questions to ask your health care provider  Why am I having this prenatal ultrasound?  What information will this exam provide?  How much does this exam cost? What costs will my insurance cover?  Can my partner or support person be with me during the exam?  When can I expect to get the results? Summary  A prenatal ultrasound is a safe and painless imaging exam that gives information about your pregnancy and your developing baby.  Routine ultrasounds are usually done between 18 and 22 weeks of pregnancy. Transvaginal ultrasound exams are often done in early pregnancy. You may have other prenatal ultrasounds as needed.  This exam has no known risks for you or your baby. After the exam, you can go home and return to all your normal activities.  Your health care provider will review the results of your ultrasound exam with you. Talk to your health care provider if you have any concerns before your exam or about your exam results. This information is not intended to replace advice given to you by your health care provider. Make sure you discuss any questions you have with your health care provider. Document Revised: 07/17/2020 Document Reviewed: 07/17/2020 Elsevier Patient Education  2021 Elsevier Inc.  

## 2021-03-05 NOTE — Progress Notes (Signed)
  Subjective  No nausea   Objective  BP 120/80   Wt 148 lb (67.1 kg)   LMP 10/23/2020   BMI 41.62 kg/m  General: NAD Pumonary: no increased work of breathing Abdomen: gravid, non-tender Extremities: no edema Psychiatric: mood appropriate, affect full  Assessment  35 y.o. G1P0000 at [redacted]w[redacted]d by  08/01/2021, by Ultrasound presenting for routine prenatal visit  Plan   Problem List Items Addressed This Visit      Other   High-risk pregnancy, first trimester   History of bariatric surgery    Other Visit Diagnoses    [redacted] weeks gestation of pregnancy    -  Primary   Relevant Orders   POC Urinalysis Dipstick OB (Completed)      pregnancy1 Problems (from 10/26/20 to present)    Problem Noted Resolved   History of bariatric surgery 01/30/2021 by Nadara Mustard, MD No   Overview Addendum 01/30/2021  3:29 PM by Nadara Mustard, MD    Discussed that a prenatal vitamin should be taken daily as well  as 60 g of protein per day and a balanced diet with an  additional 1200 mg/day of calcium citrate.  Also, we usually  recommend that serial laboratory testing be obtained each  trimester, which should consist of serum albumin, total  unionized calcium, parathyroid hormone, complete blood  count, folic acid, serum iron ferritin, and total iron binding  capacity. Levels of fat-soluble vitamins (A, D, E, and PTT as  a surigate for vitamin K) as well as B12 and 25 hydroxy  vitamin D should be checked every trimester as well.  Initial  labs demonstrated iron deficiency anemia. She is scheduled  for iron infusion in the coming week.  With respect to antenatal surveillance, reports are somewhat  conflicting with regard to infant birth weight and should be  interpreted with caution.  However, in general there is a trend  for more small for gestational age infants in those  pregnancies after bariatric surgery.  Therefore, serial ultrasounds are recommended.      Previous Version   High-risk  pregnancy, first trimester 01/02/2021 by Nadara Mustard, MD No   Overview Addendum 01/30/2021  3:56 PM by Nadara Mustard, MD    [facility] Prenatal Labs  Dating LMP and [place] 7 wks Blood type: O/Positive/-- (02/23 1525)   Genetic Screen      NIPS: nml XX Antibody:Negative (02/23 1525)  Anatomic [place] MFM [place] Rubella: 1.08 (02/23 1525) Varicella: Imm  GTT  Third trimester:  RPR: Non Reactive (02/23 1525)   Rhogam n/a HBsAg: Negative (02/23 1525)   TDaP vaccine             Flu Shot:no HIV: Non Reactive (02/23 1525)   Baby Food                                GBS:   Contraception  Pap:10/2020  CBB  NO   CS/VBAC N/A   Support Person Husband Greg        PNV [place] next week Serial growth [place] recommended CS planned Home glc monitoring one week at 28 weeks (cannot have glucola)  Annamarie Major, MD, [facility], [facility] 03/05/2021  4:15 PM

## 2021-03-07 ENCOUNTER — Other Ambulatory Visit: Payer: Self-pay

## 2021-03-07 ENCOUNTER — Inpatient Hospital Stay: Payer: BC Managed Care – PPO

## 2021-03-07 DIAGNOSIS — D508 Other iron deficiency anemias: Secondary | ICD-10-CM | POA: Diagnosis not present

## 2021-03-07 DIAGNOSIS — O99011 Anemia complicating pregnancy, first trimester: Secondary | ICD-10-CM

## 2021-03-07 DIAGNOSIS — D509 Iron deficiency anemia, unspecified: Secondary | ICD-10-CM

## 2021-03-07 LAB — CBC WITH DIFFERENTIAL/PLATELET
Abs Immature Granulocytes: 0.03 10*3/uL (ref 0.00–0.07)
Basophils Absolute: 0 10*3/uL (ref 0.0–0.1)
Basophils Relative: 0 %
Eosinophils Absolute: 0 10*3/uL (ref 0.0–0.5)
Eosinophils Relative: 1 %
HCT: 35.8 % — ABNORMAL LOW (ref 36.0–46.0)
Hemoglobin: 11.7 g/dL — ABNORMAL LOW (ref 12.0–15.0)
Immature Granulocytes: 0 %
Lymphocytes Relative: 29 %
Lymphs Abs: 2 10*3/uL (ref 0.7–4.0)
MCH: 26.2 pg (ref 26.0–34.0)
MCHC: 32.7 g/dL (ref 30.0–36.0)
MCV: 80.1 fL (ref 80.0–100.0)
Monocytes Absolute: 0.4 10*3/uL (ref 0.1–1.0)
Monocytes Relative: 6 %
Neutro Abs: 4.4 10*3/uL (ref 1.7–7.7)
Neutrophils Relative %: 64 %
Platelets: 204 10*3/uL (ref 150–400)
RBC: 4.47 MIL/uL (ref 3.87–5.11)
RDW: 25.2 % — ABNORMAL HIGH (ref 11.5–15.5)
WBC: 6.9 10*3/uL (ref 4.0–10.5)
nRBC: 0 % (ref 0.0–0.2)

## 2021-03-07 LAB — IRON AND TIBC
Iron: 79 ug/dL (ref 28–170)
Saturation Ratios: 23 % (ref 10.4–31.8)
TIBC: 340 ug/dL (ref 250–450)
UIBC: 261 ug/dL

## 2021-03-07 LAB — FERRITIN: Ferritin: 108 ng/mL (ref 11–307)

## 2021-03-08 ENCOUNTER — Other Ambulatory Visit: Payer: BC Managed Care – PPO

## 2021-03-11 ENCOUNTER — Telehealth: Payer: Self-pay

## 2021-03-11 ENCOUNTER — Telehealth: Payer: Self-pay | Admitting: Oncology

## 2021-03-11 ENCOUNTER — Inpatient Hospital Stay: Payer: BC Managed Care – PPO | Admitting: Oncology

## 2021-03-11 ENCOUNTER — Other Ambulatory Visit: Payer: Self-pay

## 2021-03-11 NOTE — Telephone Encounter (Signed)
Pt has seen Dr. Smith Robert.

## 2021-03-11 NOTE — Telephone Encounter (Signed)
Phone note 

## 2021-03-11 NOTE — Telephone Encounter (Signed)
Patient called and stated that her husband is in surgery and that she needs to reschedule her appointment today. Rescheduled for 03/20/21.

## 2021-03-12 ENCOUNTER — Other Ambulatory Visit: Payer: Self-pay

## 2021-03-12 ENCOUNTER — Ambulatory Visit: Payer: BC Managed Care – PPO | Attending: Maternal & Fetal Medicine

## 2021-03-12 DIAGNOSIS — Q774 Achondroplasia: Secondary | ICD-10-CM | POA: Insufficient documentation

## 2021-03-12 DIAGNOSIS — O99012 Anemia complicating pregnancy, second trimester: Secondary | ICD-10-CM | POA: Diagnosis not present

## 2021-03-12 DIAGNOSIS — O99352 Diseases of the nervous system complicating pregnancy, second trimester: Secondary | ICD-10-CM | POA: Diagnosis not present

## 2021-03-12 DIAGNOSIS — Z3A00 Weeks of gestation of pregnancy not specified: Secondary | ICD-10-CM

## 2021-03-12 DIAGNOSIS — O09522 Supervision of elderly multigravida, second trimester: Secondary | ICD-10-CM | POA: Diagnosis not present

## 2021-03-12 DIAGNOSIS — Z363 Encounter for antenatal screening for malformations: Secondary | ICD-10-CM | POA: Insufficient documentation

## 2021-03-12 DIAGNOSIS — Z3A19 19 weeks gestation of pregnancy: Secondary | ICD-10-CM | POA: Insufficient documentation

## 2021-03-12 DIAGNOSIS — O99891 Other specified diseases and conditions complicating pregnancy: Secondary | ICD-10-CM

## 2021-03-12 NOTE — Progress Notes (Unsigned)
MFM brief consult  Ms. Chick is here for a detailed ultrasound and follow up consultation.  Single intrauterine pregnancy here for a detailed anatomy due maternal achondroplasia, elevated BMI and AMA. Normal anatomy with measurements consistent with dates There is good fetal movement and amniotic fluid volume Suboptimal views of the fetal anatomy were obtained secondary to fetal position.  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 CPS's  are well-demarcated, anechoic, fluid-filled structures within the choroid plexus of the lateral ventricles of the brain. They are not true cysts in the pathologic sense. CPCs are often called "soft sonographic signs" or "markers" of aneuploidy, because some studies have found an association between them and fetal chromosomal abnormalities. When the CPCs are isolated, some consider them an anatomic variant.  The incidence ranges from 0.18% to 3.6% of fetuses scanned in the second trimester. Regardless, the potential clinical implications were reviewed with your patient. CPCs may be an isolated finding or associated with fetal anomalies. Associated anomalies are typically those seen with trisomy 18 and include congenital heart disease, clenched hands, single umbilical artery, intrauterine growth restriction, and rocker bottom feet.   A CPC is not a congenital brain defect.  In general, when a choroid plexus cyst is noted as an isolated finding and no other high-risk issues are noted, invasive karyotype analysis via an amniocentesis is usually not recommended.   Ms. Orser has a low risk NIPS.  Secondly, we observed an echogenic intracardiac focus. I discussed that there is no increased risk to the function or structure of the heart. In addition, in the context of a low risk NIPS result the risk for aneuploidy are reduced. There were no additional markers of aneuploidy observed.  I reviewed that an ultrasound is a screening exam and that a diagnostic exam via  amnioticenetesis is the only test available to provide a definitive result.  Lastly, Ms. Devilla has received her IV iron and her hgb/hct is improved. She is doing well. Her calcium is slightly low but she is taking chewable vitamins.   I discussed today's visit and recommended follow up growth in 6 weeks.  All questions answered.  I spent 20 minutes with >50% in face to face consultation.  Novella Olive, MD.

## 2021-03-20 ENCOUNTER — Inpatient Hospital Stay: Payer: BC Managed Care – PPO | Attending: Oncology | Admitting: Oncology

## 2021-03-20 ENCOUNTER — Other Ambulatory Visit: Payer: Self-pay

## 2021-03-20 DIAGNOSIS — O99012 Anemia complicating pregnancy, second trimester: Secondary | ICD-10-CM | POA: Diagnosis not present

## 2021-03-20 DIAGNOSIS — D509 Iron deficiency anemia, unspecified: Secondary | ICD-10-CM

## 2021-03-24 NOTE — Progress Notes (Signed)
I connected with Gina Alert on 03/24/21 at 10:30 AM EDT by video enabled telemedicine visit and verified that I am speaking with the correct person using two identifiers.   I discussed the limitations, risks, security and privacy concerns of performing an evaluation and management service by telemedicine and the availability of in-person appointments. I also discussed with the patient that there may be a patient responsible charge related to this service. The patient expressed understanding and agreed to proceed.  Other persons participating in the visit and their role in the encounter:  none  Patient's location:  work Provider's location:  Insurance underwriter Complaint:  Routine f/u of iron deficiency anemia  History of present illness: Patient is a 35 year old female with a past medical history significant for dwarfism, achondroplasia referred for microcytic anemia in the first trimester of pregnancy.  Most recent CBC from 01/02/2021 showed white cell count of 6.7, H&H of 9.3/30 with an MCV of 70 and a platelet count of 308.  Results of blood work from 01/18/2021 were as follows: CBC showed white count of 6.5, H&H of 9.3/30.7 with an MCV of 71 and a platelet count of 300.  Ferritin levels were low at 5 and TIBC elevated at 466.  B12 folate TSH were normal.  Patient received 5 doses of Venna for until 02/11/2021.  Interval history patient tolerated IV iron well without any significant side effects.  Reports improvement in her energy levels after receiving IV iron.  Her pregnancy is currently coming along well without any problems.   Review of Systems  Constitutional: Negative for chills, fever, malaise/fatigue and weight loss.  HENT: Negative for congestion, ear discharge and nosebleeds.   Eyes: Negative for blurred vision.  Respiratory: Negative for cough, hemoptysis, sputum production, shortness of breath and wheezing.   Cardiovascular: Negative for chest pain, palpitations, orthopnea and  claudication.  Gastrointestinal: Negative for abdominal pain, blood in stool, constipation, diarrhea, heartburn, melena, nausea and vomiting.  Genitourinary: Negative for dysuria, flank pain, frequency, hematuria and urgency.  Musculoskeletal: Negative for back pain, joint pain and myalgias.  Skin: Negative for rash.  Neurological: Negative for dizziness, tingling, focal weakness, seizures, weakness and headaches.  Endo/Heme/Allergies: Does not bruise/bleed easily.  Psychiatric/Behavioral: Negative for depression and suicidal ideas. The patient does not have insomnia.     Allergies  Allergen Reactions  . Sulfa Antibiotics Rash    Past Medical History:  Diagnosis Date  . Anemia   . Dwarf, achondroplastic     Past Surgical History:  Procedure Laterality Date  . GASTRIC BYPASS    . LEG SURGERY Bilateral   . MANDIBLE SURGERY    . TYMPANOSTOMY TUBE PLACEMENT      Social History   Socioeconomic History  . Marital status: Single    Spouse name: Not on file  . Number of children: Not on file  . Years of education: Not on file  . Highest education level: Not on file  Occupational History  . Occupation: Runner, broadcasting/film/video  Tobacco Use  . Smoking status: Never Smoker  . Smokeless tobacco: Never Used  Vaping Use  . Vaping Use: Never used  Substance and Sexual Activity  . Alcohol use: No  . Drug use: Never  . Sexual activity: Yes    Birth control/protection: None  Other Topics Concern  . Not on file  Social History Narrative  . Not on file   Social Determinants of Health   Financial Resource Strain: Not on file  Food Insecurity: Not on file  Transportation Needs: Not on file  Physical Activity: Not on file  Stress: Not on file  Social Connections: Not on file  Intimate Partner Violence: Not on file    Family History  Problem Relation Age of Onset  . Heart disease Father   . Alcohol abuse Father   . COPD Maternal Grandmother      Current Outpatient Medications:  .   acetaminophen (TYLENOL) 325 MG tablet, Take 650 mg by mouth every 6 (six) hours as needed., Disp: , Rfl:  .  b complex vitamins capsule, Take 1 capsule by mouth daily., Disp: , Rfl:  .  calcium citrate (CALCITRATE - DOSED IN MG ELEMENTAL CALCIUM) 950 (200 Ca) MG tablet, Take 600 mg of elemental calcium by mouth daily., Disp: , Rfl:  .  COLLAGEN PO, Take by mouth. Vital Protein brand powder, Disp: , Rfl:  .  ferrous sulfate 325 (65 FE) MG tablet, Take 325 mg by mouth daily with breakfast., Disp: , Rfl:  .  prenatal vitamin w/FE, FA (NATACHEW) 29-1 MG CHEW chewable tablet, Chew 2 tablets by mouth daily at 12 noon., Disp: , Rfl:   US MFM OB DETAIL +14 WK  Result Date: 03/12/2021 ----------------------------------------------------------------------  OBSTETRICS REPORT                       (Signed Final 03/12/2021 10:23 am) ---------------------------------------------------------------------- Patient Info  ID #:       [id number]                          D.O.B.:  [birth year] (34 yrs)  Name:       Gina Webster               Visit Date: 03/12/2021 09:01 am ---------------------------------------------------------------------- Performed By  Attending:        Lin Landsmanorenthian Booker      Ref. Address:     [address]                    [place]                                                             [address], [place],                                                             [place] [id number]  Performed By:     Blair HeysMeagan Hickernell      [facility]  Referred By:      Nadara Mustard MD ---------------------------------------------------------------------- Orders  #  Description                           Code        Ordered By  1  [place] MFM OB COMP + 14 WK ----------------------------------------------------------------------  #  Order #                      Accession #                Episode #  1                              [id number] ---------------------------------------------------------------------- Indications  [redacted] weeks gestation of pregnancy                Z3A.19  Advanced Maternal Age  Anemia  Maternal Achondroplasia ---------------------------------------------------------------------- Fetal Evaluation  Num Of Fetuses:         1  Fetal Heart Rate(bpm):  160  Cardiac Activity:       Observed  Presentation:           Breech  Placenta:               Anterior  P. Cord Insertion:      Visualized, central  Amniotic Fluid  AFI FV:      Within normal limits ---------------------------------------------------------------------- Biometry  BPD:      43.2  mm     G. Age:  19w 0d         24  %    CI:        63.88   %    70 - 86                                                          FL/HC:      17.0   %    16.8 - 19.8  HC:       174   mm     G. Age:  20w 0d         53  %    HC/AC:      1.22        1.09 - 1.39  AC:      142.2  mm     G. Age:  19w 4d         40  %    FL/BPD:     68.3   %  FL:       29.5  mm     G. Age:  19w 1d         22  %    FL/AC:      20.7   %    20 - 24  HUM:      28.4  mm     G. Age:  19w 1d         38  %  Est. FW:     290  gm    0 lb 10 oz  28  % ---------------------------------------------------------------------- Gestational Age  LMP:           20w 0d        Date:  10/23/20                 EDD:   07/30/21  Clinical EDD:  19w 5d                                        EDD:   08/01/21  U/S Today:     19w 3d                                        EDD:   08/03/21  Best:          19w 5d     Det. By:  Marcella Dubs         EDD:   08/01/21                                      (12/20/20) ---------------------------------------------------------------------- Anatomy  Cranium:               Appears normal         Aortic Arch:            Appears normal  Cavum:                 Appears normal         Ductal Arch:            Not well  visualized  Ventricles:            Appears normal         Diaphragm:              Appears normal  Choroid Plexus:        Right choroid          Stomach:                Appears normal, left                         plexus cyst                                                                        sided  Cerebellum:            Appears normal         Abdomen:                Appears normal  Posterior Fossa:       Appears normal         Abdominal Wall:         Appears nml (cord  insert, abd wall)  Nuchal Fold:           Appears normal         Cord Vessels:           Appears normal (3                                                                        vessel cord)  Face:                  Appears normal         Kidneys:                Appear normal                         (orbits and profile)  Lips:                  Appears normal         Bladder:                Appears normal  Thoracic:              Appears normal         Spine:                  Appears normal  Heart:                 Bilateral              Upper Extremities:      Appears normal                         echogenic focus  RVOT:                  Not well visualized    Lower Extremities:      Appears normal  LVOT:                  Appears normal ---------------------------------------------------------------------- Impression  Single intrauterine pregnancy here for a detailed anatomy  due maternal achondroplasia, elevated BMI and AMA.  Normal anatomy with measurements consistent with dates  There is good fetal movement and amniotic fluid volume  Suboptimal views of the fetal anatomy were obtained  secondary to fetal position.  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 CPS's  are well-demarcated, anechoic, fluid-filled structures within  the choroid plexus of the lateral ventricles of the brain. They  are not true cysts in the pathologic sense. CPCs are often  called "soft sonographic  signs" or "markers" of aneuploidy,  because some studies have found an association between  them and fetal chromosomal abnormalities. When the CPCs  are isolated, some consider them an anatomic variant.  The  incidence ranges from 0.18% to 3.6% of fetuses scanned in  the second trimester. Regardless, the potential clinical  implications were reviewed with your patient. CPCs may be  an isolated finding or associated with fetal anomalies.  Associated anomalies are typically those seen with trisomy  18 and include congenital heart disease, clenched hands,  single umbilical artery, intrauterine growth restriction, and  rocker bottom  feet.  A CPC is not a congenital brain defect.  In general, when a  choroid plexus cyst is noted as an isolated finding and no  other high-risk issues are noted, invasive karyotype analysis  via an amniocentesis is usually not recommended.  Ms. Archambeault has a low risk NIPS.  Secondly, we observed an echogenic intracardiac focus. I  discussed that there is no increased risk to the function or  structure of the heart. In addition, in the context of a low risk  NIPS result the risk for aneuploidy are reduced. There were  no additional markers of aneuploidy observed.  I reviewed  that an ultrasound is a screening exam and that a diagnostic  exam via amnioticenetesis is the only test available to provide  a definitive result. ---------------------------------------------------------------------- Recommendations  I reviewed today's examination with Ms. Effie Shy, we have  scheduled her to return in 4 weeks. ----------------------------------------------------------------------               Lin Landsman, MD Electronically Signed Final Report   03/12/2021 10:23 am ----------------------------------------------------------------------   No images are attached to the encounter.   No flowsheet data found. CBC Latest Ref Rng & Units 03/07/2021  WBC 4.0 - 10.5 K/uL 6.9  Hemoglobin 12.0 - 15.0  g/dL 11.7(L)  Hematocrit 36.0 - 46.0 % 35.8(L)  Platelets 150 - 400 K/uL 204     Observation/objective: Appears in no acute distress over video visit today.  Breathing is nonlabored  Assessment and plan: Patient is a 35 year old female currently in her second trimester of pregnancy and this is a routine visit for iron deficiency anemia follow-up  Patient's hemoglobin is improved significantly from 9.3-11.7.  Her iron studies are presently normal and she does not require any IV iron at this time.  We will repeat CBC ferritin and iron studies in 6 weeks and 12 weeks and I will see her back in 12 weeks  Follow-up instructions: As above  I discussed the assessment and treatment plan with the patient. The patient was provided an opportunity to ask questions and all were answered. The patient agreed with the plan and demonstrated an understanding of the instructions.   The patient was advised to call back or seek an in-person evaluation if the symptoms worsen or if the condition fails to improve as anticipated.   Visit Diagnosis: 1. Iron deficiency anemia, unspecified iron deficiency anemia type   2. Anemia affecting pregnancy in second trimester     Dr. Owens Shark, MD, MPH [facility] at [facility] Tel- [phone] 03/24/2021 7:48 AM

## 2021-03-25 ENCOUNTER — Telehealth: Payer: Self-pay

## 2021-03-25 NOTE — Telephone Encounter (Signed)
Pt calling; what to take for hemorrhoids.  [phone]  Adv per protocol annusol suppositories, tucks or dermaplast; warm sitz bath for 30 minutes twice a day; air dry; sleep without underwear; avoid constipation.

## 2021-03-29 ENCOUNTER — Inpatient Hospital Stay: Admission: RE | Admit: 2021-03-29 | Payer: BC Managed Care – PPO | Source: Ambulatory Visit

## 2021-04-04 ENCOUNTER — Encounter: Payer: Self-pay | Admitting: Obstetrics & Gynecology

## 2021-04-04 ENCOUNTER — Other Ambulatory Visit: Payer: Self-pay

## 2021-04-04 ENCOUNTER — Other Ambulatory Visit: Payer: Self-pay | Admitting: Obstetrics & Gynecology

## 2021-04-04 ENCOUNTER — Ambulatory Visit (INDEPENDENT_AMBULATORY_CARE_PROVIDER_SITE_OTHER): Payer: BC Managed Care – PPO | Admitting: Obstetrics & Gynecology

## 2021-04-04 VITALS — BP 100/70 | Wt 151.0 lb

## 2021-04-04 DIAGNOSIS — O0992 Supervision of high risk pregnancy, unspecified, second trimester: Secondary | ICD-10-CM

## 2021-04-04 DIAGNOSIS — Z3A23 23 weeks gestation of pregnancy: Secondary | ICD-10-CM

## 2021-04-04 DIAGNOSIS — Z9884 Bariatric surgery status: Secondary | ICD-10-CM

## 2021-04-04 DIAGNOSIS — Q774 Achondroplasia: Secondary | ICD-10-CM

## 2021-04-04 MED ORDER — ACCU-CHEK NANO SMARTVIEW W/DEVICE KIT: 1.0000 | PACK | 0 refills | Status: DC

## 2021-04-04 MED ORDER — ACCU-CHEK SOFTCLIX LANCETS MISC: 1.0000 | Freq: Four times a day (QID) | 12 refills | Status: DC

## 2021-04-04 MED ORDER — ACCU-CHEK SMARTVIEW VI STRP: ORAL_STRIP | 12 refills | Status: DC

## 2021-04-04 NOTE — Progress Notes (Signed)
Subjective  Fetal Movement? yes Contractions? no Leaking Fluid? no Vaginal Bleeding? no  Objective  BP 100/70   Wt 151 lb (68.5 kg)   LMP 10/23/2020   BMI 42.47 kg/m  General: NAD Pumonary: no increased work of breathing Abdomen: gravid, non-tender Extremities: no edema Psychiatric: mood appropriate, affect full  Assessment  34 y.o. G1P0000 at [redacted]w[redacted]d by  08/01/2021, by Ultrasound presenting for routine prenatal visit  Plan   Problem List Items Addressed This Visit      Musculoskeletal and Integument   Achondroplasia     Other   Supervision of high-risk pregnancy, second trimester - Primary   History of bariatric surgery   Relevant Orders   VITAMIN D 25 Hydroxy (Vit-D Deficiency, Fractures)   APTT   Vitamin B12    Other Visit Diagnoses    [redacted] weeks gestation of pregnancy          pregnancy1 Problems (from 10/26/20 to present)    Problem Noted Resolved   History of bariatric surgery 01/30/2021 by Nadara Mustard, MD No   Overview Addendum 01/30/2021  3:29 PM by Nadara Mustard, MD    Discussed that a prenatal vitamin should be taken daily as well  as 60 g of protein per day and a balanced diet with an  additional 1200 mg/day of calcium citrate.  Also, we usually  recommend that serial laboratory testing be obtained each  trimester, which should consist of serum albumin, total  unionized calcium, parathyroid hormone, complete blood  count, folic acid, serum iron ferritin, and total iron binding  capacity. Levels of fat-soluble vitamins (A, D, E, and PTT as  a surigate for vitamin K) as well as B12 and 25 hydroxy  vitamin D should be checked every trimester as well.  Initial  labs demonstrated iron deficiency anemia. She is scheduled  for iron infusion in the coming week.  With respect to antenatal surveillance, reports are somewhat  conflicting with regard to infant birth weight and should be  interpreted with caution.  However, in general there is a trend  for  more small for gestational age infants in those  pregnancies after bariatric surgery.  Therefore, serial ultrasounds are recommended.      Previous Version   Supervision of high-risk pregnancy, second trimester 01/02/2021 by Nadara Mustard, MD No   Overview Addendum 01/30/2021  3:56 PM by Nadara Mustard, MD    Clinic [facility] Prenatal Labs  Dating LMP and [place] 7 wks Blood type: O/Positive/-- (02/23 1525)   Genetic Screen      NIPS: nml XX Antibody:Negative (02/23 1525)  Anatomic [place] MFM [place] Rubella: 1.08 (02/23 1525) Varicella: Imm  GTT  Third trimester:  RPR: Non Reactive (02/23 1525)   Rhogam n/a HBsAg: Negative (02/23 1525)   TDaP vaccine             Flu Shot:no HIV: Non Reactive (02/23 1525)   Baby Food                                GBS:   Contraception  Pap:10/2020  CBB  NO   CS/VBAC N/A   Support Person Creola Corn        Glucose home checks for 5 days prior to next appt (prior bariatrics, no glucola)  PNV  [place] next month MFM follow up  Anes consult in June  CS planned at 38-39 weeks [facility])  Labs today (  vitamin and PTT levels)  Annamarie Major, MD, [facility], [facility] 04/04/2021  4:41 PM

## 2021-04-05 LAB — VITAMIN B12: Vitamin B-12: 409 pg/mL (ref 232–1245)

## 2021-04-05 LAB — APTT: aPTT: 28 s (ref 24–33)

## 2021-04-05 LAB — VITAMIN D 25 HYDROXY (VIT D DEFICIENCY, FRACTURES): Vit D, 25-Hydroxy: 24.4 ng/mL — ABNORMAL LOW (ref 30.0–100.0)

## 2021-04-10 ENCOUNTER — Telehealth: Payer: Self-pay

## 2021-04-10 NOTE — Telephone Encounter (Signed)
Pt calling to see when to start checking her blood sugars.  [phone]  Adv to go ahead and start.  Pt states she will start tomorrow.  Adv to ck before breakfast, 2h after breakfast, lunch, and dinner.

## 2021-04-12 ENCOUNTER — Telehealth: Payer: Self-pay

## 2021-04-12 NOTE — Telephone Encounter (Signed)
Pt calling to see how many days she is supposed to ck her blood sugars and log; she is using a friend's meter.  [phone]  Left detailed msg to ck sugars for five days. (pt was id'd on vm)

## 2021-04-15 ENCOUNTER — Other Ambulatory Visit: Payer: Self-pay | Admitting: Obstetrics & Gynecology

## 2021-04-15 DIAGNOSIS — O09522 Supervision of elderly multigravida, second trimester: Secondary | ICD-10-CM

## 2021-04-19 ENCOUNTER — Encounter
Admission: RE | Admit: 2021-04-19 | Discharge: 2021-04-19 | Disposition: A | Discharge status: Home / Self Care | Payer: BC Managed Care – PPO | Source: Ambulatory Visit | Attending: Anesthesiology | Admitting: Anesthesiology

## 2021-04-19 ENCOUNTER — Other Ambulatory Visit: Payer: Self-pay

## 2021-04-19 NOTE — Consult Note (Signed)
[facility] Anesthesia Consultation  Gina Webster YKZ:[id number] DOB: Mar 24, 1986 DOA: 04/19/2021 PCP: Patient, No Pcp Per (Inactive)   Requesting physician: Dr. Kenton Kingfisher Date of consultation: 04/19/21 Reason for consultation: Gina Webster plasia   CHIEF COMPLAINT:  Obesity during pregnancy  HISTORY OF PRESENT ILLNESS: Gina Webster  is a 35 y.o. female with a known history of achondroplasia. This is her first pregnancy. Denies hx of cardiovascular disease. Denies hx of asthma. Denies personal or family hx of bleeding disorders. Denies hx of problems with anesthesia.   PAST MEDICAL HISTORY:   Past Medical History:  Diagnosis Date   Anemia    Dwarf, achondroplastic     PAST SURGICAL HISTORY:  Past Surgical History:  Procedure Laterality Date   GASTRIC BYPASS     LEG SURGERY Bilateral    MANDIBLE SURGERY     TYMPANOSTOMY TUBE PLACEMENT      SOCIAL HISTORY:  Social History   Tobacco Use   Smoking status: Never   Smokeless tobacco: Never  Substance Use Topics   Alcohol use: No    FAMILY HISTORY:  Family History  Problem Relation Age of Onset   Heart disease Father    Alcohol abuse Father    COPD Maternal Grandmother     DRUG ALLERGIES:  Allergies  Allergen Reactions   Sulfa Antibiotics Rash    REVIEW OF SYSTEMS:   RESPIRATORY: No cough, shortness of breath, wheezing.  CARDIOVASCULAR: No chest pain, orthopnea, edema.  HEMATOLOGY: No anemia, easy bruising or bleeding SKIN: No rash or lesion. NEUROLOGIC: No tingling, numbness, weakness.  PSYCHIATRY: No anxiety or depression.   MEDICATIONS AT HOME:  Prior to Admission medications   Medication Sig Start Date End Date Taking? Authorizing Provider  Accu-Chek Softclix Lancets lancets 1 each by Other route 4 (four) times daily. 04/04/21   Gae Dry, MD  acetaminophen (TYLENOL) 325 MG tablet Take 650 mg by mouth every 6 (six) hours as needed.    [provider]   b complex vitamins capsule Take 1 capsule by mouth daily.    [provider]  Blood Glucose Calibration (ACCU-CHEK AVIVA) SOLN 1 KIT BY SUBDERMAL ROUTE AS DIRECTED. CHECK BLOOD SUGARS FOR FASTING, AND TWO HOURS AFTER BREAKFAST, LUNCH AND DINNER (4 CHECKS DAILY). DO THIS FOR 5 DAYS PRIOR TO NEXT VISIT. 04/04/21   Gae Dry, MD  calcium citrate (CALCITRATE - DOSED IN MG ELEMENTAL CALCIUM) 950 (200 Ca) MG tablet Take 600 mg of elemental calcium by mouth daily.    [provider]  COLLAGEN PO Take by mouth. Vital Protein brand powder    [provider]  ferrous sulfate 325 (65 FE) MG tablet Take 325 mg by mouth daily with breakfast.    [provider]  glucose blood (ACCU-CHEK SMARTVIEW) test strip Use as instructed to check blood sugars 4 times daily 04/04/21   Gae Dry, MD  prenatal vitamin w/FE, FA (NATACHEW) 29-1 MG CHEW chewable tablet Chew 2 tablets by mouth daily at 12 noon.    [provider]      PHYSICAL EXAMINATION:   VITAL SIGNS: Last menstrual period 10/23/2020.  GENERAL:  35 y.o.-year-old patient no acute distress, short stature.  HEENT: Head atraumatic, normocephalic. Oropharynx and nasopharynx clear. MP 2, TM distance >3 cm, normal mouth opening, unable to bite upper lip LUNGS: No use of accessory muscles of respiration.   EXTREMITIES: No pedal edema, cyanosis, or clubbing.  NEUROLOGIC: normal gait PSYCHIATRIC: The patient is alert and oriented x  3.  SKIN: No obvious rash, lesion, or ulcer.    IMPRESSION AND PLAN:   Gina Webster  is a 35 y.o. female presenting with achondroplasia during pregnancy. Planning for cesarean delivery.   Discussed plan for epidural for cesarean delivery to allow for controlled dosing. Would not recommend spinal anesthesia given difficulty with determining appropriate dose in setting of short stature. Discussed backup plan for GA if problems with epidural anesthesia.

## 2021-04-22 ENCOUNTER — Telehealth: Payer: Self-pay

## 2021-04-22 NOTE — Telephone Encounter (Signed)
Pt called after hour nurse 04/19/21 7:27pm c/o having some indigestion and heartburn; 25wks;  After hour nurse [employer]acid - [place], maalox one hour after meals and before bedtime.  [phone]  Left msg to return call.

## 2021-04-23 ENCOUNTER — Other Ambulatory Visit: Payer: Self-pay

## 2021-04-23 ENCOUNTER — Ambulatory Visit: Payer: BC Managed Care – PPO | Attending: Obstetrics

## 2021-04-23 DIAGNOSIS — O99012 Anemia complicating pregnancy, second trimester: Secondary | ICD-10-CM | POA: Diagnosis not present

## 2021-04-23 DIAGNOSIS — O09522 Supervision of elderly multigravida, second trimester: Secondary | ICD-10-CM | POA: Diagnosis not present

## 2021-04-23 DIAGNOSIS — O99891 Other specified diseases and conditions complicating pregnancy: Secondary | ICD-10-CM | POA: Diagnosis not present

## 2021-04-23 DIAGNOSIS — Z3A25 25 weeks gestation of pregnancy: Secondary | ICD-10-CM | POA: Insufficient documentation

## 2021-04-23 DIAGNOSIS — Q774 Achondroplasia: Secondary | ICD-10-CM

## 2021-04-23 DIAGNOSIS — O99842 Bariatric surgery status complicating pregnancy, second trimester: Secondary | ICD-10-CM | POA: Diagnosis not present

## 2021-05-01 ENCOUNTER — Inpatient Hospital Stay: Payer: BC Managed Care – PPO | Attending: Oncology

## 2021-05-01 ENCOUNTER — Other Ambulatory Visit: Payer: Self-pay

## 2021-05-01 DIAGNOSIS — D508 Other iron deficiency anemias: Secondary | ICD-10-CM | POA: Diagnosis not present

## 2021-05-01 DIAGNOSIS — O99012 Anemia complicating pregnancy, second trimester: Secondary | ICD-10-CM

## 2021-05-01 DIAGNOSIS — D509 Iron deficiency anemia, unspecified: Secondary | ICD-10-CM

## 2021-05-01 LAB — CBC WITH DIFFERENTIAL/PLATELET
Abs Immature Granulocytes: 0.06 10*3/uL (ref 0.00–0.07)
Basophils Absolute: 0 10*3/uL (ref 0.0–0.1)
Basophils Relative: 0 %
Eosinophils Absolute: 0 10*3/uL (ref 0.0–0.5)
Eosinophils Relative: 1 %
HCT: 38 % (ref 36.0–46.0)
Hemoglobin: 12.7 g/dL (ref 12.0–15.0)
Immature Granulocytes: 1 %
Lymphocytes Relative: 23 %
Lymphs Abs: 1.8 10*3/uL (ref 0.7–4.0)
MCH: 29.3 pg (ref 26.0–34.0)
MCHC: 33.4 g/dL (ref 30.0–36.0)
MCV: 87.6 fL (ref 80.0–100.0)
Monocytes Absolute: 0.3 10*3/uL (ref 0.1–1.0)
Monocytes Relative: 4 %
Neutro Abs: 5.5 10*3/uL (ref 1.7–7.7)
Neutrophils Relative %: 71 %
Platelets: 163 10*3/uL (ref 150–400)
RBC: 4.34 MIL/uL (ref 3.87–5.11)
RDW: 17.9 % — ABNORMAL HIGH (ref 11.5–15.5)
WBC: 7.8 10*3/uL (ref 4.0–10.5)
nRBC: 0 % (ref 0.0–0.2)

## 2021-05-01 LAB — IRON AND TIBC
Iron: 131 ug/dL (ref 28–170)
Saturation Ratios: 30 % (ref 10.4–31.8)
TIBC: 442 ug/dL (ref 250–450)
UIBC: 311 ug/dL

## 2021-05-01 LAB — FERRITIN: Ferritin: 28 ng/mL (ref 11–307)

## 2021-05-07 ENCOUNTER — Encounter: Payer: Self-pay | Admitting: Obstetrics & Gynecology

## 2021-05-07 ENCOUNTER — Other Ambulatory Visit: Payer: Self-pay

## 2021-05-07 ENCOUNTER — Ambulatory Visit (INDEPENDENT_AMBULATORY_CARE_PROVIDER_SITE_OTHER): Payer: BC Managed Care – PPO | Admitting: Obstetrics & Gynecology

## 2021-05-07 VITALS — BP 100/70 | Wt 151.0 lb

## 2021-05-07 DIAGNOSIS — Q774 Achondroplasia: Secondary | ICD-10-CM

## 2021-05-07 DIAGNOSIS — Z9884 Bariatric surgery status: Secondary | ICD-10-CM

## 2021-05-07 DIAGNOSIS — Z3A27 27 weeks gestation of pregnancy: Secondary | ICD-10-CM

## 2021-05-07 DIAGNOSIS — O0992 Supervision of high risk pregnancy, unspecified, second trimester: Secondary | ICD-10-CM

## 2021-05-07 LAB — POCT URINALYSIS DIPSTICK OB
Glucose, UA: NEGATIVE
POC,PROTEIN,UA: NEGATIVE

## 2021-05-07 MED ORDER — CLOTRIMAZOLE-BETAMETHASONE 1-0.05 % EX CREA: 1.0000 "application " | TOPICAL_CREAM | Freq: Two times a day (BID) | CUTANEOUS | 0 refills | Status: AC

## 2021-05-07 NOTE — Patient Instructions (Signed)

## 2021-05-07 NOTE — Progress Notes (Signed)
  Subjective  Fetal Movement? yes Contractions? no Leaking Fluid? no Vaginal Bleeding? no  Objective  BP 100/70   Wt 151 lb (68.5 kg)   LMP 10/23/2020   BMI 31.56 kg/m  General: NAD Pumonary: no increased work of breathing Abdomen: gravid, non-tender Extremities: no edema Psychiatric: mood appropriate, affect full  Assessment  35 y.o. G1P0000 at [redacted]w[redacted]d by  08/01/2021, by Ultrasound presenting for routine prenatal visit  Plan   Problem List Items Addressed This Visit      Musculoskeletal and Integument   Achondroplasia     Other   Supervision of high-risk pregnancy, second trimester - Primary   Relevant Orders   POC Urinalysis Dipstick OB (Completed)   History of bariatric surgery  Other Visit Diagnoses    [redacted] weeks gestation of pregnancy          pregnancy1 Problems (from 10/26/20 to present)    Problem Noted Resolved   History of bariatric surgery 01/30/2021 by Nadara Mustard, MD No   Overview Addendum 01/30/2021  3:29 PM by Nadara Mustard, MD    Discussed that a prenatal vitamin should be taken daily as well  as 60 g of protein per day and a balanced diet with an  additional 1200 mg/day of calcium citrate.  Also, we usually  recommend that serial laboratory testing be obtained each  trimester, which should consist of serum albumin, total  unionized calcium, parathyroid hormone, complete blood  count, folic acid, serum iron ferritin, and total iron binding  capacity. Levels of fat-soluble vitamins (A, D, E, and PTT as  a surigate for vitamin K) as well as B12 and 25 hydroxy  vitamin D should be checked every trimester as well.  Initial  labs demonstrated iron deficiency anemia. She is scheduled  for iron infusion in the coming week.  With respect to antenatal surveillance, reports are somewhat  conflicting with regard to infant birth weight and should be  interpreted with caution.  However, in general there is a trend  for more small for gestational age  infants in those  pregnancies after bariatric surgery.  Therefore, serial ultrasounds are recommended.       Supervision of high-risk pregnancy, second trimester 01/02/2021 by Nadara Mustard, MD No   Overview Addendum 01/30/2021  3:56 PM by Nadara Mustard, MD    Clinic [facility] Prenatal Labs  Dating LMP and [place] 7 wks Blood type: O/Positive/-- (02/23 1525)   Genetic Screen      NIPS: nml XX Antibody:Negative (02/23 1525)  Anatomic [place] MFM [place] Rubella: 1.08 (02/23 1525) Varicella: Imm  GTT  Third trimester:  RPR: Non Reactive (02/23 1525)   Rhogam n/a HBsAg: Negative (02/23 1525)   TDaP vaccine             Flu Shot:no HIV: Non Reactive (02/23 1525)   Baby Food   Br                             GBS:   Contraception  Depo Pap:10/2020  CBB  NO   CS/VBAC N/A   Support Person Creola Corn              [place] per MFM  PNV, [facility]  Discussed CS at 38 weeks (sch for 07/22/21 am)  Breast feeding  Depo for pp contraception  Anes consult discussed  Annamarie Major, MD, [facility], [facility] 05/07/2021  10:29 AM

## 2021-05-07 NOTE — Addendum Note (Signed)
Addended by: Nadara Mustard on: 05/07/2021 10:40 AM   Modules accepted: Orders

## 2021-05-16 ENCOUNTER — Other Ambulatory Visit: Payer: Self-pay | Admitting: Obstetrics & Gynecology

## 2021-05-16 DIAGNOSIS — O09523 Supervision of elderly multigravida, third trimester: Secondary | ICD-10-CM

## 2021-05-16 DIAGNOSIS — O99843 Bariatric surgery status complicating pregnancy, third trimester: Secondary | ICD-10-CM

## 2021-05-16 DIAGNOSIS — O99013 Anemia complicating pregnancy, third trimester: Secondary | ICD-10-CM

## 2021-05-16 DIAGNOSIS — Q774 Achondroplasia: Secondary | ICD-10-CM

## 2021-05-23 ENCOUNTER — Ambulatory Visit: Payer: BC Managed Care – PPO | Attending: Maternal & Fetal Medicine

## 2021-05-23 ENCOUNTER — Other Ambulatory Visit: Payer: Self-pay

## 2021-05-23 VITALS — BP 121/66 | HR 82 | Temp 98.0°F | Resp 20 | Ht <= 58 in | Wt 150.5 lb

## 2021-05-23 DIAGNOSIS — O99891 Other specified diseases and conditions complicating pregnancy: Secondary | ICD-10-CM

## 2021-05-23 DIAGNOSIS — O09523 Supervision of elderly multigravida, third trimester: Secondary | ICD-10-CM

## 2021-05-23 DIAGNOSIS — O0992 Supervision of high risk pregnancy, unspecified, second trimester: Secondary | ICD-10-CM

## 2021-05-23 DIAGNOSIS — Q774 Achondroplasia: Secondary | ICD-10-CM

## 2021-05-23 DIAGNOSIS — D649 Anemia, unspecified: Secondary | ICD-10-CM | POA: Diagnosis not present

## 2021-05-23 DIAGNOSIS — O99843 Bariatric surgery status complicating pregnancy, third trimester: Secondary | ICD-10-CM | POA: Diagnosis not present

## 2021-05-23 DIAGNOSIS — Z3A30 30 weeks gestation of pregnancy: Secondary | ICD-10-CM | POA: Diagnosis not present

## 2021-05-23 DIAGNOSIS — O352XX0 Maternal care for (suspected) hereditary disease in fetus, not applicable or unspecified: Secondary | ICD-10-CM | POA: Insufficient documentation

## 2021-05-23 DIAGNOSIS — O99013 Anemia complicating pregnancy, third trimester: Secondary | ICD-10-CM

## 2021-05-23 DIAGNOSIS — Z9884 Bariatric surgery status: Secondary | ICD-10-CM

## 2021-05-23 IMAGING — US US MFM OB FOLLOW-UP
1 series · 14 of 28 positions shown · non-contrast
Comparison: none

[Series 1: us mfm ob follow-up · 14 of 54 slices shown]
[im 2/54]
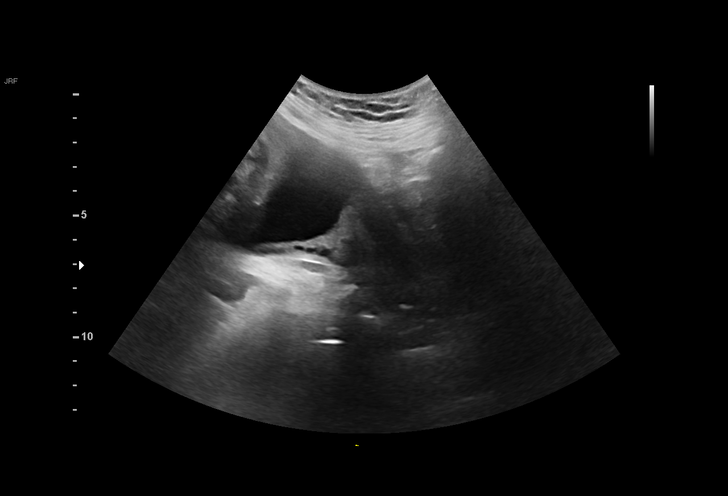
[im 6/54]
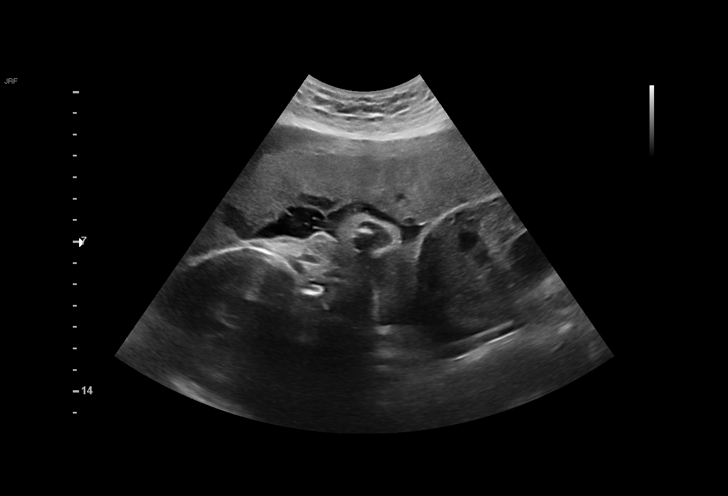
[im 10/54]
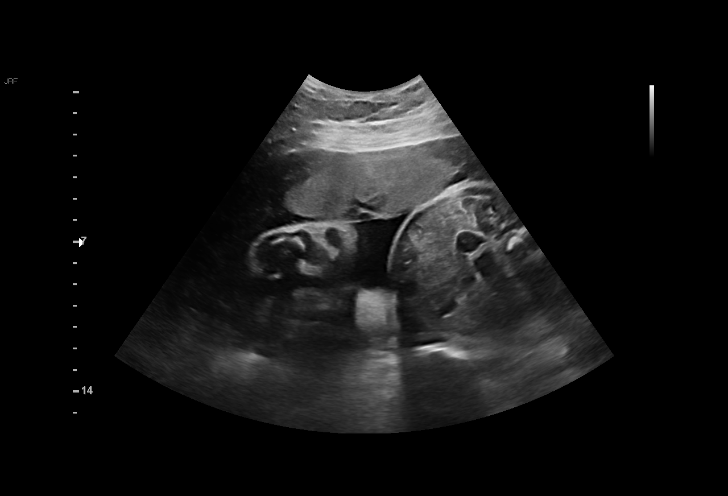
[im 14/54]
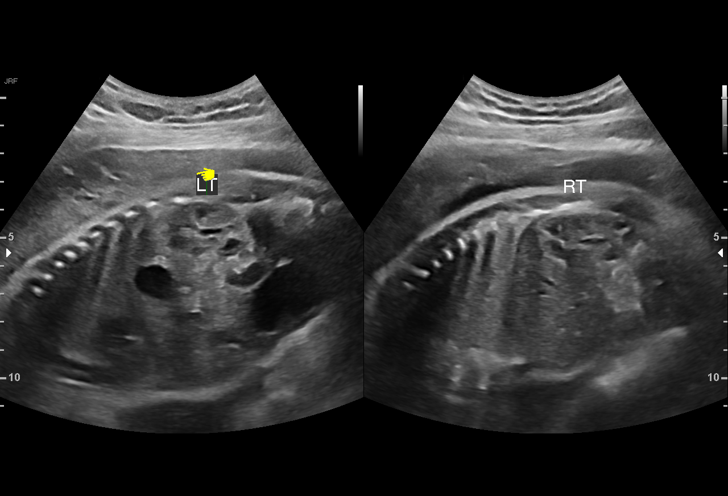
[im 18/54]
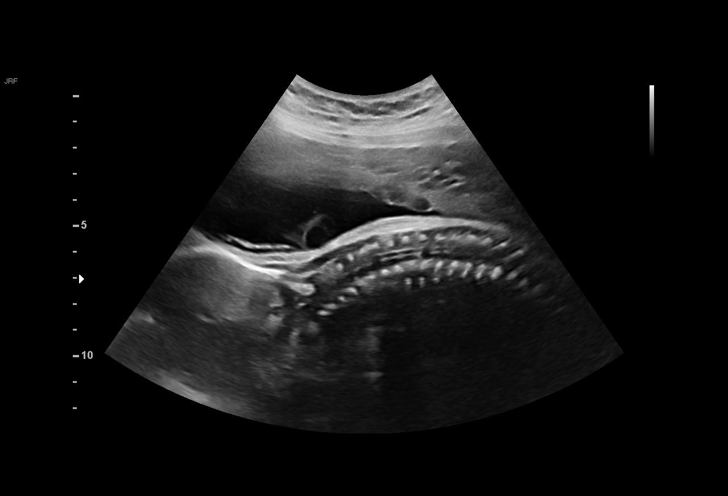
[im 22/54]
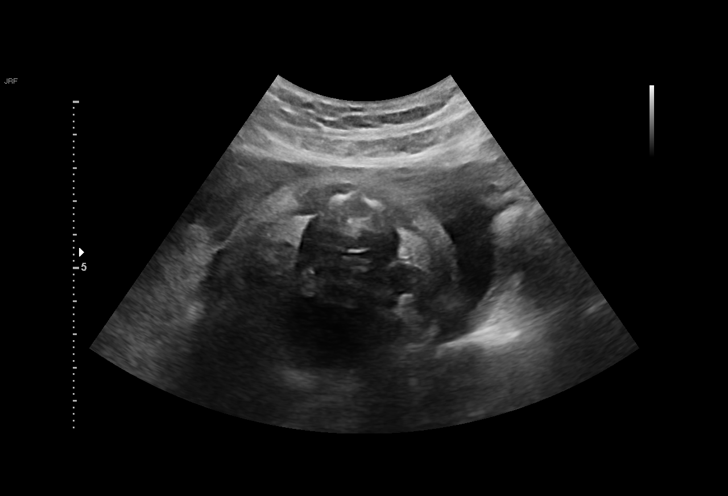
[im 26/54]
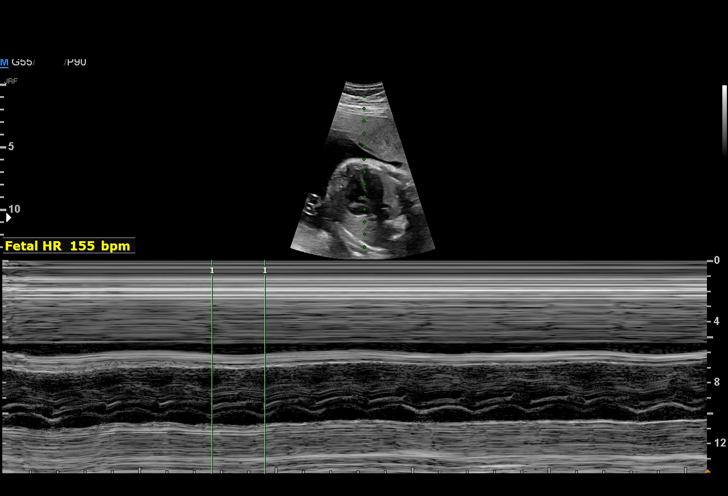
[im 30/54]
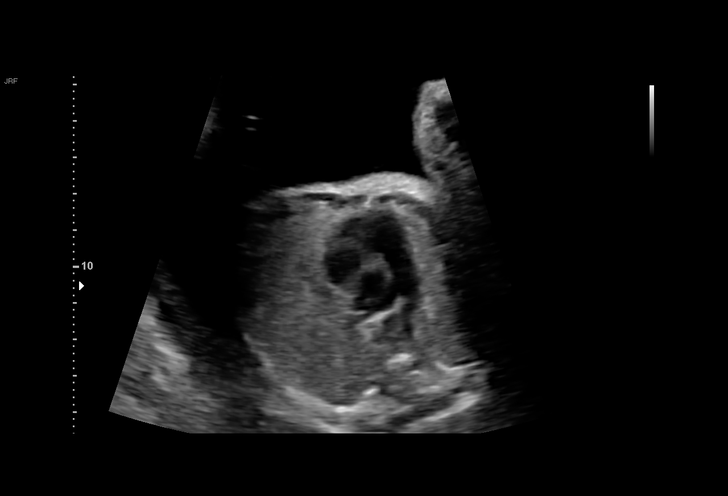
[im 34/54]
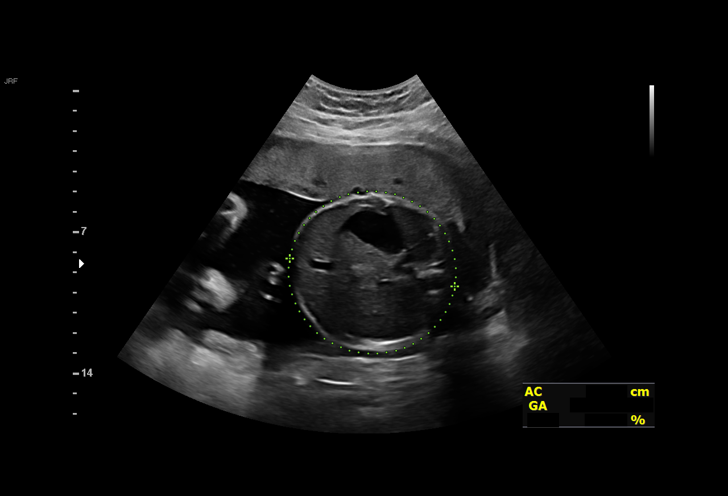
[im 38/54]
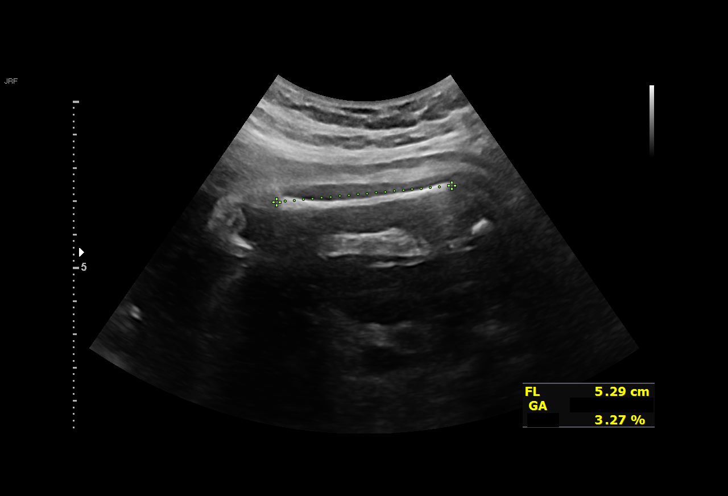
[im 42/54]
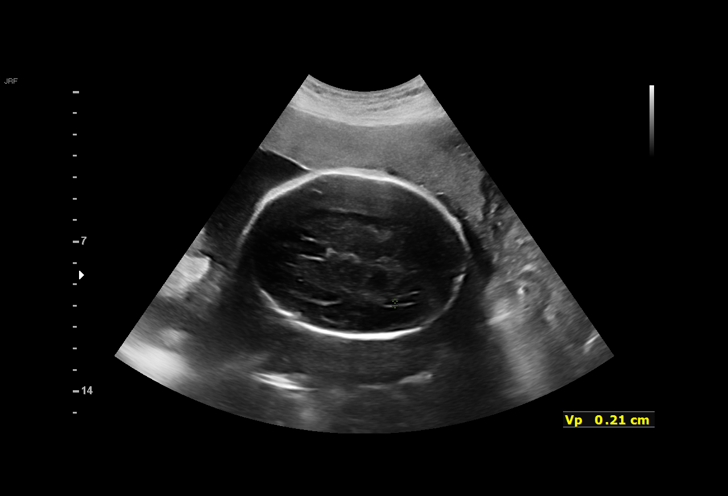
[im 46/54]
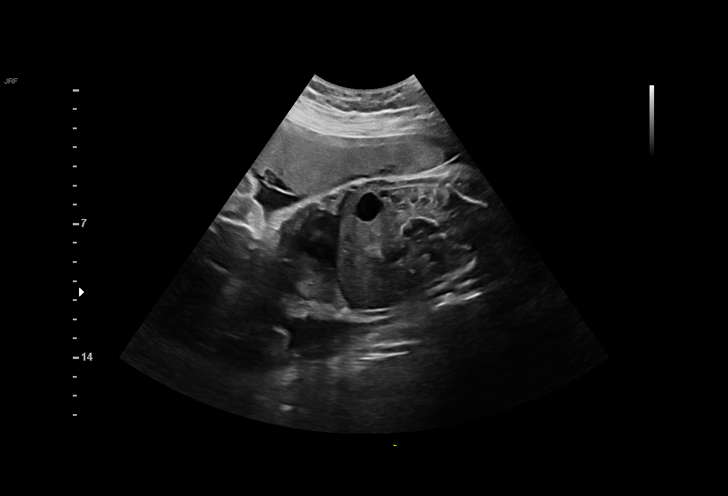
[im 50/54]
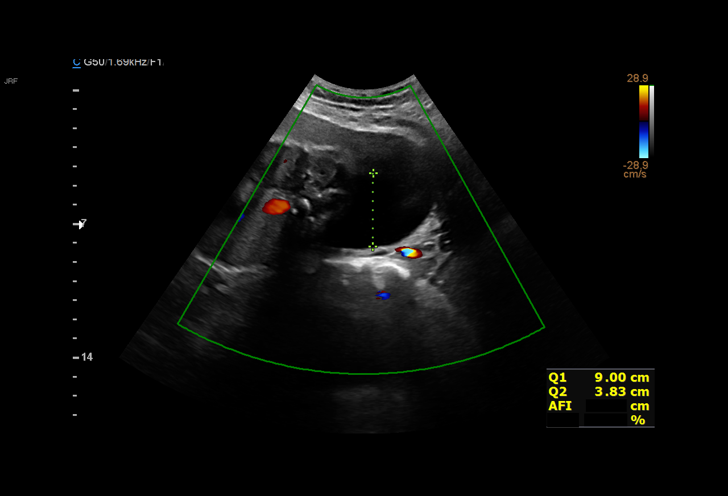
[im 54/54]
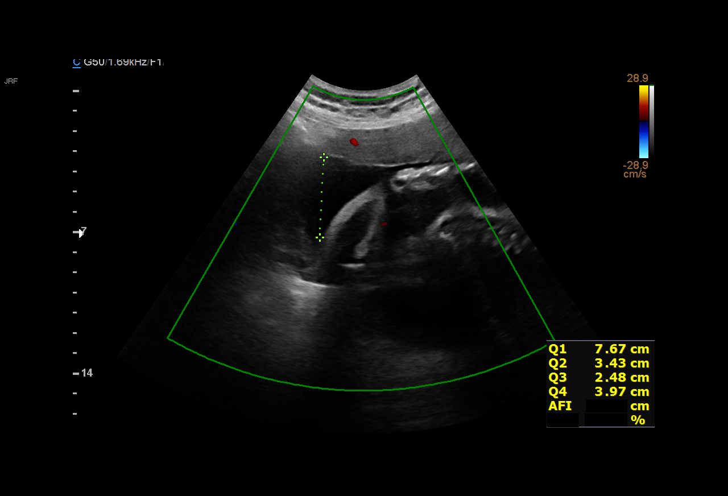

[14 of 28 positions shown; findings below may reference images not displayed]

Indications

 30 weeks gestation of pregnancy
 Pregnancy complicated by previous gastric      [RK]
 bypass, antepartum, third trimester
 Advanced maternal age multigravida 35+,        [RK]
 third trimester
 Anemia during pregnancy in third trimester     [RK]
 Maternal achondroplasia
Fetal Evaluation

 Num Of Fetuses:         1
 Fetal Heart Rate(bpm):  155
 Cardiac Activity:       Observed
 Presentation:           Breech
 Placenta:               Anterior

 AFI Sum(cm)     %Tile       Largest Pocket(cm)
 17.6            66

 RUQ(cm)       RLQ(cm)       LUQ(cm)        LLQ(cm)
 7.7           4
Biometry

 BPD:      75.4  mm     G. Age:  30w 2d         46  %    CI:           72   %    70 - 86
                                                         FL/HC:      18.5   %    19.2 -
 HC:      282.8  mm     G. Age:  31w 0d         44  %    HC/AC:      1.10        0.99 -
 AC:      257.5  mm     G. Age:  29w 6d         42  %    FL/BPD:     69.2   %    71 - 87
 FL:       52.2  mm     G. Age:  27w 6d        1.9  %    FL/AC:      20.3   %    20 - 24
 HUM:      47.5  mm     G. Age:  27w 6d         11  %
 LV:        2.1  mm

 Est. FW:    [RK]  gm      3 lb 1 oz     18  %
Gestational Age

 LMP:           30w 2d        Date:  [DATE]                 EDD:   [DATE]
 Clinical EDD:  30w 0d                                        EDD:   [DATE]
 U/S Today:     29w 5d                                        EDD:   [DATE]
 Best:          30w 0d     Det. By:  Early Ultrasound         EDD:   [DATE]
                                     ([DATE])
Anatomy

 Cranium:               Appears normal         Aortic Arch:            Previously seen
 Cavum:                 Appears normal         Ductal Arch:            Not well visualized
 Ventricles:            Appears normal         Diaphragm:              Appears normal
 Choroid Plexus:        Previously seen        Stomach:                Appears normal, left
                                                                       sided
 Cerebellum:            Previously seen        Abdomen:                Appears normal
 Posterior Fossa:       Previously seen        Abdominal Wall:         Previously seen
 Nuchal Fold:           Not applicable (>20    Cord Vessels:           Previously seen
                        wks GA)
 Face:                  Orbits and profile     Kidneys:                Appear normal
                        previously seen
 Lips:                  Previously seen        Bladder:                Appears normal
 Thoracic:              Appears normal         Spine:                  Appears normal
 Heart:                 Appears normal; EIF    Upper Extremities:      Previously seen
 RVOT:                  Appears normal         Lower Extremities:      Previously seen
 LVOT:                  Appears normal
Cervix Uterus Adnexa

 Cervix
 Length:           4.37  cm.
Impression

 Follow up growth due to maternal achodroplasia
 Normal interval growth with measurements consistent with
 dates
 Good fetal movement and amniotic fluid volume

 EIF is resolved today.
Recommendations

 Follow up growth in 4 weeks.

## 2021-05-30 ENCOUNTER — Ambulatory Visit (INDEPENDENT_AMBULATORY_CARE_PROVIDER_SITE_OTHER): Payer: BC Managed Care – PPO | Admitting: Obstetrics & Gynecology

## 2021-05-30 ENCOUNTER — Other Ambulatory Visit: Payer: Self-pay

## 2021-05-30 ENCOUNTER — Encounter: Payer: Self-pay | Admitting: Obstetrics & Gynecology

## 2021-05-30 VITALS — BP 110/70 | Wt 148.0 lb

## 2021-05-30 DIAGNOSIS — O0992 Supervision of high risk pregnancy, unspecified, second trimester: Secondary | ICD-10-CM

## 2021-05-30 DIAGNOSIS — Z3A31 31 weeks gestation of pregnancy: Secondary | ICD-10-CM

## 2021-05-30 DIAGNOSIS — Z9884 Bariatric surgery status: Secondary | ICD-10-CM

## 2021-05-30 DIAGNOSIS — Q774 Achondroplasia: Secondary | ICD-10-CM

## 2021-05-30 NOTE — Progress Notes (Signed)
  Subjective  Fetal Movement? yes Contractions? no Leaking Fluid? no Vaginal Bleeding? no  Objective  BP 110/70   Wt 148 lb (67.1 kg)   LMP 10/23/2020   BMI 41.62 kg/m  General: NAD Pumonary: no increased work of breathing Abdomen: gravid, non-tender Extremities: no edema Psychiatric: mood appropriate, affect full  Assessment  35 y.o. G1P0000 at [redacted]w[redacted]d by  08/01/2021, by Ultrasound presenting for routine prenatal visit  Plan   Problem List Items Addressed This Visit      Musculoskeletal and Integument   Achondroplasia     Other   Supervision of high-risk pregnancy, second trimester - Primary   History of bariatric surgery  Other Visit Diagnoses    [redacted] weeks gestation of pregnancy        [place] from 7/14 reviewed, normal growth  pregnancy1 Problems (from 10/26/20 to present)    Problem Noted Resolved   History of bariatric surgery 01/30/2021 by Nadara Mustard, MD No   Overview Addendum 01/30/2021  3:29 PM by Nadara Mustard, MD    Discussed that a prenatal vitamin should be taken daily as well  as 60 g of protein per day and a balanced diet with an  additional 1200 mg/day of calcium citrate.  Also, we usually  recommend that serial laboratory testing be obtained each  trimester, which should consist of serum albumin, total  unionized calcium, parathyroid hormone, complete blood  count, folic acid, serum iron ferritin, and total iron binding  capacity. Levels of fat-soluble vitamins (A, D, E, and PTT as  a surigate for vitamin K) as well as B12 and 25 hydroxy  vitamin D should be checked every trimester as well.  Initial  labs demonstrated iron deficiency anemia. She is scheduled  for iron infusion in the coming week.  With respect to antenatal surveillance, reports are somewhat  conflicting with regard to infant birth weight and should be  interpreted with caution.  However, in general there is a trend  for more small for gestational age infants in those  pregnancies  after bariatric surgery.  Therefore, serial ultrasounds are recommended.      Supervision of high-risk pregnancy, second trimester 01/02/2021 by Nadara Mustard, MD No   Overview Addendum 05/07/2021 10:30 AM by Nadara Mustard, MD    Clinic [facility] Prenatal Labs  Dating LMP and [place] 7 wks Blood type: O/Positive/-- (02/23 1525)   Genetic Screen      NIPS: nml XX Antibody:Negative (02/23 1525)  Anatomic [place] MFM [place] Rubella: 1.08 (02/23 1525) Varicella: Imm  GTT  Third trimester:  RPR: Non Reactive (02/23 1525)   Rhogam n/a HBsAg: Negative (02/23 1525)   TDaP vaccine             Flu Shot:no HIV: Non Reactive (02/23 1525)   Baby Food    Breast                            GBS:   Contraception    Depo Pap:10/2020  CBB  NO   CS/VBAC N/A   Support Person Creola Corn     CS sch for 07/22/21    Due to achondroplasia          Annamarie Major, MD, [facility], [facility] 05/30/2021  11:54 AM

## 2021-06-11 ENCOUNTER — Other Ambulatory Visit: Payer: Self-pay

## 2021-06-11 ENCOUNTER — Ambulatory Visit (INDEPENDENT_AMBULATORY_CARE_PROVIDER_SITE_OTHER): Payer: BC Managed Care – PPO | Admitting: Obstetrics & Gynecology

## 2021-06-11 ENCOUNTER — Encounter: Payer: Self-pay | Admitting: Obstetrics & Gynecology

## 2021-06-11 VITALS — BP 100/64 | Wt 149.0 lb

## 2021-06-11 DIAGNOSIS — Z23 Encounter for immunization: Secondary | ICD-10-CM | POA: Diagnosis not present

## 2021-06-11 DIAGNOSIS — Z9884 Bariatric surgery status: Secondary | ICD-10-CM

## 2021-06-11 DIAGNOSIS — Z3A32 32 weeks gestation of pregnancy: Secondary | ICD-10-CM

## 2021-06-11 DIAGNOSIS — O0992 Supervision of high risk pregnancy, unspecified, second trimester: Secondary | ICD-10-CM

## 2021-06-11 DIAGNOSIS — Q774 Achondroplasia: Secondary | ICD-10-CM

## 2021-06-11 LAB — POCT URINALYSIS DIPSTICK OB
Glucose, UA: NEGATIVE
POC,PROTEIN,UA: NEGATIVE

## 2021-06-11 NOTE — Progress Notes (Signed)
Prenatal Visit Note Date: 06/11/2021 Clinic: [facility]  Subjective:  Gina Webster is a 35 y.o. G1P0000 at [redacted]w[redacted]d being seen today for ongoing prenatal care.  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  Patient reports backache.   Contractions: Not present. Vag. Bleeding: None.  Movement: Present. Denies leaking of fluid.   The following portions of the patient's history were reviewed and updated as appropriate: allergies, current medications, past family history, past medical history, past social history, past surgical history and problem list. Problem list updated.  Objective:   Vitals:   06/11/21 0935 06/11/21 0940  BP:  100/64  Weight: 149 lb (67.6 kg) 149 lb (67.6 kg)    Fetal Status:     Movement: Present     General:  Alert, oriented and cooperative. Patient is in no acute distress.  Skin: Skin is warm and dry. No rash noted.   Cardiovascular: Normal heart rate noted  Respiratory: Normal respiratory effort, no problems with respiration noted  Abdomen: Soft, gravid, appropriate for gestational age. Pain/Pressure: Present     Pelvic:  Cervical exam deferred        Extremities: Normal range of motion.     Mental Status: Normal mood and affect. Normal behavior. Normal judgment and thought content.   Urinalysis:      Assessment and Plan:  Pregnancy: G1P0000 at [redacted]w[redacted]d  1. Supervision of high-risk pregnancy, second trimester - [place] next week (MFM)  2. [redacted] weeks gestation of pregnancy PTL precautions  3. History of bariatric surgery PNV  4. Achondroplasia CS planned 07/22/21  Preterm labor symptoms and general obstetric precautions  including but not limited to vaginal bleeding, contractions, leaking of fluid and fetal movement were reviewed in detail with the patient. Please refer to After Visit Summary for other counseling recommendations.   pregnancy1 Problems (from 10/26/20 to present)     Problem Noted Resolved   History of bariatric surgery 01/30/2021 by Nadara Mustard, MD No   Overview Addendum 01/30/2021  3:29 PM by Nadara Mustard, MD    Discussed that a prenatal vitamin should be taken daily as well  as 60 g of protein per day and a balanced diet with an  additional 1200 mg/day of calcium citrate.  Also, we usually  recommend that serial laboratory testing be obtained each  trimester, which should consist of serum albumin, total  unionized calcium, parathyroid hormone, complete blood  count, folic acid, serum iron ferritin, and total iron binding  capacity. Levels of fat-soluble vitamins (A, D, E, and PTT as  a surigate for vitamin K) as well as B12 and 25 hydroxy  vitamin D should be checked every trimester as well.  Initial  labs demonstrated iron deficiency anemia. She is scheduled  for iron infusion in the coming week.  With respect to antenatal surveillance, reports are somewhat  conflicting with regard to infant birth weight and should be  interpreted with caution.  However, in general there is a trend  for more small for gestational age infants in those  pregnancies after bariatric surgery.  Therefore, serial ultrasounds are recommended.       Supervision of high-risk pregnancy, second trimester 01/02/2021 by Nadara Mustard, MD No   Overview  Addendum 05/07/2021 10:30 AM by Nadara Mustard, MD    Clinic [facility] Prenatal Labs  Dating LMP and [place] 7 wks Blood type: O/Positive/-- (02/23 1525)   Genetic Screen      NIPS: nml XX Antibody:Negative (02/23 1525)  Anatomic [place] MFM [place] Rubella: 1.08 (02/23 1525) Varicella: Imm  GTT  Third trimester:  RPR: Non Reactive (02/23 1525)   Rhogam n/a HBsAg:  Negative (02/23 1525)   TDaP vaccine             Flu Shot:no HIV: Non Reactive (02/23 1525)   Baby Food    Breast                            GBS:   Contraception    Depo Pap:10/2020  CBB  NO   CS/VBAC N/A   Support Person Tera Mater, MD, [facility], [facility] 06/11/2021  9:59 AM

## 2021-06-11 NOTE — Addendum Note (Signed)
Addended by: Kathlene Cote on: 06/11/2021 10:09 AM   Modules accepted: Orders

## 2021-06-11 NOTE — Patient Instructions (Signed)
Cesarean Delivery Cesarean birth, or cesarean delivery, is the surgical delivery of a baby through an incision in the abdomen and the uterus. This may be referred to as a C-section. This procedure may be scheduled ahead of time, or it may be done inan emergency situation. Tell a health care provider about: Any allergies you have. All medicines you are taking, including vitamins, herbs, eye drops, creams, and over-the-counter medicines. Any problems you or family members have had with anesthetic medicines. Any blood disorders you have. Any surgeries you have had. Any medical conditions you have. Whether you or any members of your family have a history of deep vein thrombosis (DVT) or pulmonary embolism (PE). What are the risks? Generally, this is a safe procedure. However, problems may occur, including: Infection. Bleeding. Allergic reactions to medicines. Damage to other structures or organs. Blood clots. Injury to your baby. What happens before the procedure? General instructions Follow instructions from your health care provider about eating or drinking restrictions. If you know that you are going to have a cesarean delivery, do not shave your pubic area. Shaving before the procedure may increase your risk of infection. Plan to have someone take you home from the hospital. Ask your health care provider what steps will be taken to prevent infection. These may include: Removing hair at the surgery site. Washing skin with a germ-killing soap. Taking antibiotic medicine. Depending on the reason for your cesarean delivery, you may have a physical exam or additional testing, such as an ultrasound. You may have your blood or urine tested. Questions for your health care provider Ask your health care provider about: Changing or stopping your regular medicines. This is especially important if you are taking diabetes medicines or blood thinners. Your pain management plan. This is especially  important if you plan to breastfeed your baby. How long you will be in the hospital after the procedure. Any concerns you may have about receiving blood products, if you need them during the procedure. Cord blood banking, if you plan to collect your baby's umbilical cord blood. You may also want to ask your health care provider: Whether you will be able to hold or breastfeed your baby while you are still in the operating room. Whether your baby can stay with you immediately after the procedure and during your recovery. Whether a family member or a person of your choice can go with you into the operating room and stay with you during the procedure, immediately after the procedure, and during your recovery. What happens during the procedure?  An IV will be inserted into one of your veins. Fluid and medicines, such as antibiotics, will be given before the surgery. Fetal monitors will be placed on your abdomen to check your baby's heart rate. You may be given a special warming gown to wear to keep your temperature stable. A catheter may be inserted into your bladder through your urethra. This drains your urine during the procedure. You may be given one or more of the following: A medicine to numb the area (local anesthetic). A medicine to make you fall asleep (general anesthetic). A medicine (regional anesthetic) that is injected into your back or through a small thin tube placed in your back (spinal anesthetic or epidural anesthetic). This numbs everything below the injection site and allows you to stay awake during your procedure. If this makes you feel nauseous, tell your health care provider. Medicines will be available to help reduce any nausea you may feel. An incision will   be made in your abdomen, and then in your uterus. If you are awake during your procedure, you may feel tugging and pulling in your abdomen, but you should not feel pain. If you feel pain, tell your health care provider  immediately. Your baby will be removed from your uterus. You may feel more pressure or pushing while this happens. Immediately after birth, your baby will be dried and kept warm. You may be able to hold and breastfeed your baby. The umbilical cord may be clamped and cut during this time. This usually occurs after waiting a period of 1-2 minutes after delivery. Your placenta will be removed from your uterus. Your incisions will be closed with stitches (sutures). Staples, skin glue, or adhesive strips may also be applied to the incision in your abdomen. Bandages (dressings) may be placed over the incision in your abdomen. The procedure may vary among health care providers and hospitals. What happens after the procedure? Your blood pressure, heart rate, breathing rate, and blood oxygen level will be monitored until you are discharged from the hospital. You may continue to receive fluids and medicines through an IV. You will have some pain. Medicines will be available to help control your pain. To help prevent blood clots: You may be given medicines. You may have to wear compression stockings or devices. You will be encouraged to walk around when you are able. Hospital staff will encourage and support bonding with your baby. Your hospital may have you and your baby to stay in the same room (rooming in) during your hospital stay to encourage successful bonding and breastfeeding. You may be encouraged to cough and breathe deeply often. This helps to prevent lung problems. If you have a catheter draining your urine, it will be removed as soon as possible after your procedure. Summary Cesarean birth, or cesarean delivery, is the surgical delivery of a baby through an incision in the abdomen and the uterus. Follow instructions from your health care provider about eating or drinking restrictions before the procedure. You will have some pain after the procedure. Medicines will be available to help control  your pain. Hospital staff will encourage and support bonding with your baby after the procedure. Your hospital may have you and your baby to stay in the same room (rooming in) during your hospital stay to encourage successful bonding and breastfeeding. This information is not intended to replace advice given to you by your health care provider. Make sure you discuss any questions you have with your healthcare provider. Document Revised: 06/25/2020 Document Reviewed: 05/03/2018 Elsevier Patient Education  2022 Elsevier Inc.  

## 2021-06-12 ENCOUNTER — Inpatient Hospital Stay: Payer: BC Managed Care – PPO | Attending: Oncology

## 2021-06-12 DIAGNOSIS — Z79899 Other long term (current) drug therapy: Secondary | ICD-10-CM | POA: Diagnosis not present

## 2021-06-12 DIAGNOSIS — D509 Iron deficiency anemia, unspecified: Secondary | ICD-10-CM

## 2021-06-12 DIAGNOSIS — O99012 Anemia complicating pregnancy, second trimester: Secondary | ICD-10-CM

## 2021-06-12 LAB — IRON AND TIBC
Iron: 102 ug/dL (ref 28–170)
Saturation Ratios: 20 % (ref 10.4–31.8)
TIBC: 505 ug/dL — ABNORMAL HIGH (ref 250–450)
UIBC: 403 ug/dL

## 2021-06-12 LAB — CBC WITH DIFFERENTIAL/PLATELET
Abs Immature Granulocytes: 0.11 10*3/uL — ABNORMAL HIGH (ref 0.00–0.07)
Basophils Absolute: 0 10*3/uL (ref 0.0–0.1)
Basophils Relative: 0 %
Eosinophils Absolute: 0.1 10*3/uL (ref 0.0–0.5)
Eosinophils Relative: 1 %
HCT: 39.6 % (ref 36.0–46.0)
Hemoglobin: 13.3 g/dL (ref 12.0–15.0)
Immature Granulocytes: 1 %
Lymphocytes Relative: 20 %
Lymphs Abs: 1.7 10*3/uL (ref 0.7–4.0)
MCH: 30.9 pg (ref 26.0–34.0)
MCHC: 33.6 g/dL (ref 30.0–36.0)
MCV: 92.1 fL (ref 80.0–100.0)
Monocytes Absolute: 0.5 10*3/uL (ref 0.1–1.0)
Monocytes Relative: 6 %
Neutro Abs: 6.3 10*3/uL (ref 1.7–7.7)
Neutrophils Relative %: 72 %
Platelets: 148 10*3/uL — ABNORMAL LOW (ref 150–400)
RBC: 4.3 MIL/uL (ref 3.87–5.11)
RDW: 14.7 % (ref 11.5–15.5)
WBC: 8.7 10*3/uL (ref 4.0–10.5)
nRBC: 0 % (ref 0.0–0.2)

## 2021-06-12 LAB — FERRITIN: Ferritin: 11 ng/mL (ref 11–307)

## 2021-06-13 ENCOUNTER — Telehealth: Payer: Self-pay

## 2021-06-13 NOTE — Telephone Encounter (Signed)
FMLA/DISABILITY form for [employer] filled out; will get signature then fax.

## 2021-06-14 ENCOUNTER — Inpatient Hospital Stay (HOSPITAL_BASED_OUTPATIENT_CLINIC_OR_DEPARTMENT_OTHER): Payer: BC Managed Care – PPO | Admitting: Oncology

## 2021-06-14 ENCOUNTER — Other Ambulatory Visit: Payer: BC Managed Care – PPO

## 2021-06-14 DIAGNOSIS — O99013 Anemia complicating pregnancy, third trimester: Secondary | ICD-10-CM

## 2021-06-14 DIAGNOSIS — D509 Iron deficiency anemia, unspecified: Secondary | ICD-10-CM | POA: Diagnosis not present

## 2021-06-17 ENCOUNTER — Other Ambulatory Visit: Payer: Self-pay | Admitting: Maternal & Fetal Medicine

## 2021-06-17 DIAGNOSIS — O99843 Bariatric surgery status complicating pregnancy, third trimester: Secondary | ICD-10-CM

## 2021-06-17 DIAGNOSIS — O99891 Other specified diseases and conditions complicating pregnancy: Secondary | ICD-10-CM

## 2021-06-17 DIAGNOSIS — O0993 Supervision of high risk pregnancy, unspecified, third trimester: Secondary | ICD-10-CM

## 2021-06-17 DIAGNOSIS — Z3A34 34 weeks gestation of pregnancy: Secondary | ICD-10-CM

## 2021-06-17 DIAGNOSIS — O09513 Supervision of elderly primigravida, third trimester: Secondary | ICD-10-CM

## 2021-06-17 DIAGNOSIS — O99213 Obesity complicating pregnancy, third trimester: Secondary | ICD-10-CM

## 2021-06-18 ENCOUNTER — Inpatient Hospital Stay: Payer: BC Managed Care – PPO

## 2021-06-18 ENCOUNTER — Other Ambulatory Visit: Payer: Self-pay

## 2021-06-18 VITALS — BP 115/72 | HR 88 | Temp 96.9°F | Resp 20

## 2021-06-18 DIAGNOSIS — D509 Iron deficiency anemia, unspecified: Secondary | ICD-10-CM | POA: Diagnosis not present

## 2021-06-18 DIAGNOSIS — O99011 Anemia complicating pregnancy, first trimester: Secondary | ICD-10-CM

## 2021-06-18 MED ORDER — IRON SUCROSE 20 MG/ML IV SOLN
200.0000 mg | Freq: Once | INTRAVENOUS | Status: AC
  Administered 2021-06-18: 200 mg via INTRAVENOUS
  Filled 2021-06-18: qty 10

## 2021-06-18 MED ORDER — SODIUM CHLORIDE 0.9 % IV SOLN: 200.0000 mg | INTRAVENOUS | Status: DC

## 2021-06-18 MED ORDER — SODIUM CHLORIDE 0.9 % IV SOLN
Freq: Once | INTRAVENOUS | Status: AC
  Filled 2021-06-18: qty 250

## 2021-06-18 NOTE — Patient Instructions (Signed)
[facility] [place] [facility]  Discharge Instructions: Thank you for choosing [facility] to provide your oncology and hematology care.  If you have a lab appointment with the Cancer Center, please go directly to the Cancer Center and check in at the registration area.  Wear comfortable clothing and clothing appropriate for easy access to any Portacath or PICC line.   We strive to give you quality time with your provider. You may need to reschedule your appointment if you arrive late (15 or more minutes).  Arriving late affects you and other patients whose appointments are after yours.  Also, if you miss three or more appointments without notifying the office, you may be dismissed from the clinic at the provider's discretion.      For prescription refill requests, have your pharmacy contact our office and allow 72 hours for refills to be completed.    Today you received the following chemotherapy and/or immunotherapy agents venofer   To help prevent nausea and vomiting after your treatment, we encourage you to take your nausea medication as directed.  BELOW ARE SYMPTOMS THAT SHOULD BE REPORTED IMMEDIATELY: *FEVER GREATER THAN 100.4 F (38 C) OR HIGHER *CHILLS OR SWEATING *NAUSEA AND VOMITING THAT IS NOT CONTROLLED WITH YOUR NAUSEA MEDICATION *UNUSUAL SHORTNESS OF BREATH *UNUSUAL BRUISING OR BLEEDING *URINARY PROBLEMS (pain or burning when urinating, or frequent urination) *BOWEL PROBLEMS (unusual diarrhea, constipation, pain near the anus) TENDERNESS IN MOUTH AND THROAT WITH OR WITHOUT PRESENCE OF ULCERS (sore throat, sores in mouth, or a toothache) UNUSUAL RASH, SWELLING OR PAIN  UNUSUAL VAGINAL DISCHARGE OR ITCHING   Items with * indicate a potential emergency and should be followed up as soon as possible or go to the Emergency Department if any problems should occur.  Please show the CHEMOTHERAPY ALERT CARD or IMMUNOTHERAPY ALERT CARD at check-in to the  Emergency Department and triage nurse.  Should you have questions after your visit or need to cancel or reschedule your appointment, please contact [facility] [place]  [phone] and follow the prompts.  Office hours are 8:00 a.m. to 4:30 p.m. Monday - Friday. Please note that voicemails left after 4:00 p.m. may not be returned until the following business day.  We are closed weekends and major holidays. You have access to a nurse at all times for urgent questions. Please call the main number to the clinic [phone] and follow the prompts.  For any non-urgent questions, you may also contact your provider using MyChart. We now offer e-Visits for anyone 18 and older to request care online for non-urgent symptoms. For details visit [email or web address].   Also download the MyChart app! Go to the app store, search "MyChart", open the app, select [facility], and log in with your MyChart username and password.  Due to Covid, a mask is required upon entering the hospital/clinic. If you do not have a mask, one will be given to you upon arrival. For doctor visits, patients may have 1 support person aged 18 or older with them. For treatment visits, patients cannot have anyone with them due to current Covid guidelines and our immunocompromised population.  

## 2021-06-20 ENCOUNTER — Inpatient Hospital Stay: Payer: BC Managed Care – PPO

## 2021-06-20 ENCOUNTER — Other Ambulatory Visit: Payer: Self-pay

## 2021-06-20 ENCOUNTER — Ambulatory Visit: Payer: BC Managed Care – PPO | Attending: Maternal & Fetal Medicine

## 2021-06-20 VITALS — BP 110/84 | HR 82 | Temp 97.5°F | Resp 20 | Ht <= 58 in | Wt 151.4 lb

## 2021-06-20 VITALS — BP 102/68 | HR 108 | Temp 97.4°F | Resp 16

## 2021-06-20 DIAGNOSIS — O09523 Supervision of elderly multigravida, third trimester: Secondary | ICD-10-CM | POA: Diagnosis not present

## 2021-06-20 DIAGNOSIS — D509 Iron deficiency anemia, unspecified: Secondary | ICD-10-CM | POA: Diagnosis not present

## 2021-06-20 DIAGNOSIS — O99213 Obesity complicating pregnancy, third trimester: Secondary | ICD-10-CM

## 2021-06-20 DIAGNOSIS — E669 Obesity, unspecified: Secondary | ICD-10-CM | POA: Diagnosis not present

## 2021-06-20 DIAGNOSIS — Q774 Achondroplasia: Secondary | ICD-10-CM

## 2021-06-20 DIAGNOSIS — O0993 Supervision of high risk pregnancy, unspecified, third trimester: Secondary | ICD-10-CM

## 2021-06-20 DIAGNOSIS — O99843 Bariatric surgery status complicating pregnancy, third trimester: Secondary | ICD-10-CM

## 2021-06-20 DIAGNOSIS — O09513 Supervision of elderly primigravida, third trimester: Secondary | ICD-10-CM | POA: Diagnosis not present

## 2021-06-20 DIAGNOSIS — Z3A34 34 weeks gestation of pregnancy: Secondary | ICD-10-CM | POA: Diagnosis not present

## 2021-06-20 DIAGNOSIS — O99011 Anemia complicating pregnancy, first trimester: Secondary | ICD-10-CM

## 2021-06-20 DIAGNOSIS — O99891 Other specified diseases and conditions complicating pregnancy: Secondary | ICD-10-CM

## 2021-06-20 DIAGNOSIS — O99019 Anemia complicating pregnancy, unspecified trimester: Secondary | ICD-10-CM

## 2021-06-20 DIAGNOSIS — O99013 Anemia complicating pregnancy, third trimester: Secondary | ICD-10-CM | POA: Insufficient documentation

## 2021-06-20 MED ORDER — IRON SUCROSE 20 MG/ML IV SOLN
200.0000 mg | Freq: Once | INTRAVENOUS | Status: AC
  Administered 2021-06-20: 200 mg via INTRAVENOUS
  Filled 2021-06-20: qty 10

## 2021-06-20 MED ORDER — SODIUM CHLORIDE 0.9 % IV SOLN
Freq: Once | INTRAVENOUS | Status: AC
Start: 2021-06-20 — End: 2021-06-20
  Filled 2021-06-20: qty 250

## 2021-06-20 MED ORDER — SODIUM CHLORIDE 0.9 % IV SOLN: 200.0000 mg | INTRAVENOUS | Status: DC

## 2021-06-20 NOTE — Patient Instructions (Signed)

## 2021-06-22 ENCOUNTER — Encounter: Payer: Self-pay | Admitting: Oncology

## 2021-06-22 NOTE — Progress Notes (Signed)
I connected with Gina Webster on 06/22/21 at  2:45 PM EDT by video enabled telemedicine visit and verified that I am speaking with the correct person using two identifiers.   I discussed the limitations, risks, security and privacy concerns of performing an evaluation and management service by telemedicine and the availability of in-person appointments. I also discussed with the patient that there may be a patient responsible charge related to this service. The patient expressed understanding and agreed to proceed.  Other persons participating in the visit and their role in the encounter:  none  Patient's location:  home Provider's location:  work  Risk analyst Complaint: Routine follow-up of iron deficiency anemia  History of present illness: Patient is a 35 year old female with a past medical history significant for dwarfism, achondroplasia referred for microcytic anemia in the first trimester of pregnancy.  Most recent CBC from 01/02/2021 showed white cell count of 6.7, H&H of 9.3/30 with an MCV of 70 and a platelet count of 308.   Results of blood work from 01/18/2021 were as follows: CBC showed white count of 6.5, H&H of 9.3/30.7 with an MCV of 71 and a platelet count of 300.  Ferritin levels were low at 5 and TIBC elevated at 466.  B12 folate TSH were normal.  Patient received 5 doses of Venofer march- 02/11/2021.  Interval history patient is doing well overall but still has some ongoing fatigue.  She is currently at33 weeks of gestation   Review of Systems  Constitutional:  Positive for malaise/fatigue. Negative for chills, fever and weight loss.  HENT:  Negative for congestion, ear discharge and nosebleeds.   Eyes:  Negative for blurred vision.  Respiratory:  Negative for cough, hemoptysis, sputum production, shortness of breath and wheezing.   Cardiovascular:  Negative for chest pain, palpitations, orthopnea and claudication.  Gastrointestinal:  Negative for abdominal pain, blood in stool,  constipation, diarrhea, heartburn, melena, nausea and vomiting.  Genitourinary:  Negative for dysuria, flank pain, frequency, hematuria and urgency.  Musculoskeletal:  Negative for back pain, joint pain and myalgias.  Skin:  Negative for rash.  Neurological:  Negative for dizziness, tingling, focal weakness, seizures, weakness and headaches.  Endo/Heme/Allergies:  Does not bruise/bleed easily.  Psychiatric/Behavioral:  Negative for depression and suicidal ideas. The patient does not have insomnia.    Allergies  Allergen Reactions   Sulfa Antibiotics Rash    Past Medical History:  Diagnosis Date   Anemia    Dwarf, achondroplastic     Past Surgical History:  Procedure Laterality Date   GASTRIC BYPASS     LEG SURGERY Bilateral    MANDIBLE SURGERY     TYMPANOSTOMY TUBE PLACEMENT      Social History   Socioeconomic History   Marital status: Single    Spouse name: Not on file   Number of children: Not on file   Years of education: Not on file   Highest education level: Not on file  Occupational History   Occupation: Teacher  Tobacco Use   Smoking status: Never   Smokeless tobacco: Never  Vaping Use   Vaping Use: Never used  Substance and Sexual Activity   Alcohol use: No   Drug use: Never   Sexual activity: Yes    Birth control/protection: None  Other Topics Concern   Not on file  Social History Narrative   Not on file   Social Determinants of Health   Financial Resource Strain: Not on file  Food Insecurity: Not on file  Transportation  Needs: Not on file  Physical Activity: Not on file  Stress: Not on file  Social Connections: Not on file  Intimate Partner Violence: Not on file    Family History  Problem Relation Age of Onset   Heart disease Father    Alcohol abuse Father    COPD Maternal Grandmother      Current Outpatient Medications:    Accu-Chek Softclix Lancets lancets, 1 each by Other route 4 (four) times daily. (Patient not taking: Reported on  06/11/2021), Disp: 100 each, Rfl: 12   acetaminophen (TYLENOL) 325 MG tablet, Take 650 mg by mouth every 6 (six) hours as needed., Disp: , Rfl:    b complex vitamins capsule, Take 1 capsule by mouth daily., Disp: , Rfl:    Blood Glucose Calibration (ACCU-CHEK AVIVA) SOLN, 1 KIT BY SUBDERMAL ROUTE AS DIRECTED. CHECK BLOOD SUGARS FOR FASTING, AND TWO HOURS AFTER BREAKFAST, LUNCH AND DINNER (4 CHECKS DAILY). DO THIS FOR 5 DAYS PRIOR TO NEXT VISIT. (Patient not taking: Reported on 06/11/2021), Disp: 1 each, Rfl: 0   calcium citrate (CALCITRATE - DOSED IN MG ELEMENTAL CALCIUM) 950 (200 Ca) MG tablet, Take 600 mg of elemental calcium by mouth daily. (Patient not taking: Reported on 06/11/2021), Disp: , Rfl:    clotrimazole-betamethasone (LOTRISONE) cream, Apply 1 application topically 2 (two) times daily., Disp: 30 g, Rfl: 0   COLLAGEN PO, Take by mouth. Vital Protein brand powder, Disp: , Rfl:    ferrous sulfate 325 (65 FE) MG tablet, Take 325 mg by mouth daily with breakfast., Disp: , Rfl:    glucose blood (ACCU-CHEK SMARTVIEW) test strip, Use as instructed to check blood sugars 4 times daily (Patient not taking: Reported on 06/11/2021), Disp: 100 each, Rfl: 12   prenatal vitamin w/FE, FA (NATACHEW) 29-1 MG CHEW chewable tablet, Chew 2 tablets by mouth daily at 12 noon., Disp: , Rfl:   [place] MFM OB FOLLOW UP  Result Date: 05/23/2021 ----------------------------------------------------------------------  OBSTETRICS REPORT                       (Signed Final 05/23/2021 03:04 pm) ---------------------------------------------------------------------- Patient Info  ID #:       [id number]                          D.O.B.:  [birth year] (35 yrs)  Name:       Gina Webster               Visit Date: 05/23/2021 02:34 pm ---------------------------------------------------------------------- Performed By  Attending:        Sander Nephew      Ref. Address:     [address], [place],  [place] [address]  Performed By:     Ilda Basset         [facility] at                                                             [facility]  Referred By:      Gae Dry MD ---------------------------------------------------------------------- Orders  #  Description                           Code        Ordered By  1  [place] MFM OB FOLLOW UP                   60454.09    Teresa Coombs ----------------------------------------------------------------------  #  Order #                     Accession #                Episode #  1  [id number]                   [id number]                 [id number] ---------------------------------------------------------------------- Indications  [redacted] weeks gestation of pregnancy                Z3A.70  Pregnancy complicated by previous gastric      O99.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4            2.5 ---------------------------------------------------------------------- Biometry  BPD:      75.4  mm     G. Age:  30w 2d  46  %    CI:           72   %    70 - 86                                                          FL/HC:      18.5   %    19.2 - 21.4  HC:      282.8  mm     G. Age:  31w 0d         44  %     HC/AC:      1.10        0.99 - 1.21  AC:      257.5  mm     G. Age:  29w 6d         42  %    FL/BPD:     69.2   %    71 - 87  FL:       52.2  mm     G. Age:  27w 6d        1.9  %    FL/AC:      20.3   %    20 - 24  HUM:      47.5  mm     G. Age:  27w 6d         11  %  LV:        2.1  mm  Est. FW:    1381  gm      3 lb 1 oz     18  % ---------------------------------------------------------------------- Gestational Age  LMP:           30w 2d        Date:  10/23/20                 EDD:   07/30/21  Clinical EDD:  Georgiann Hahn 0d                                        EDD:   08/01/21  U/S Today:     29w 5d                                        EDD:   08/03/21  Best:          30w 0d     Det. ByMarcella Dubs         EDD:   08/01/21                                      (12/20/20) ---------------------------------------------------------------------- Anatomy  Cranium:               Appears normal         Aortic Arch:            Previously seen  Cavum:                 Appears normal  Ductal Arch:            Not well visualized  Ventricles:            Appears normal         Diaphragm:              Appears normal  Choroid Plexus:        Previously seen        Stomach:                Appears normal, left                                                                        sided  Cerebellum:            Previously seen        Abdomen:                Appears normal  Posterior Fossa:       Previously seen        Abdominal Wall:         Previously seen  Nuchal Fold:           Not applicable (>32    Cord Vessels:           Previously seen                         wks GA)  Face:                  Orbits and profile     Kidneys:                Appear normal                         previously seen  Lips:                  Previously seen        Bladder:                Appears normal  Thoracic:              Appears normal         Spine:                  Appears normal  Heart:                 Appears normal; EIF    Upper  Extremities:      Previously seen  RVOT:                  Appears normal         Lower Extremities:      Previously seen  LVOT:                  Appears normal ---------------------------------------------------------------------- Cervix Uterus Adnexa  Cervix  Length:           4.37  cm. ---------------------------------------------------------------------- Impression  Follow up growth due to maternal achodroplasia  Normal interval growth with measurements consistent with  dates  Good fetal movement and amniotic fluid volume  EIF is  resolved today. ---------------------------------------------------------------------- Recommendations  Follow up growth in 4 weeks. ----------------------------------------------------------------------               Sander Nephew, MD Electronically Signed Final Report   05/23/2021 03:04 pm ----------------------------------------------------------------------   No images are attached to the encounter.   No flowsheet data found. CBC Latest Ref Rng & Units 06/12/2021  WBC 4.0 - 10.5 K/uL 8.7  Hemoglobin 12.0 - 15.0 g/dL 13.3  Hematocrit 36.0 - 46.0 % 39.6  Platelets 150 - 400 K/uL 148(L)     Observation/objective: Appears in no acute distress over video visit today.  Breathing is nonlabored  Assessment and plan: Patient is a 35 year old female currently at 73 weeks of gestation and history of iron deficiency anemia here for routine follow-up.    Patient received 5 doses of Venofer between March and April 2022.  Her hemoglobin is presently improved to 13.3 as compared to 11.7 before.  However her ferritin levels are still low at 11 with an elevated TIBC of 505.  Given that she is currently close to her delivery I will plan to give her 5 more doses of Venofer.  I will see her after her delivery sometime in early December with CBC ferritin and iron studies and B12.   Follow-up instructions: As above  I discussed the assessment and treatment plan with the patient. The  patient was provided an opportunity to ask questions and all were answered. The patient agreed with the plan and demonstrated an understanding of the instructions.   The patient was advised to call back or seek an in-person evaluation if the symptoms worsen or if the condition fails to improve as anticipated.  Visit Diagnosis: 1. Iron deficiency anemia, unspecified iron deficiency anemia type   2. Anemia affecting pregnancy in third trimester     Dr. Randa Evens, MD, MPH [facility] at [facility] Tel- [phone] 06/22/2021 11:59 AM

## 2021-06-24 ENCOUNTER — Inpatient Hospital Stay: Payer: BC Managed Care – PPO

## 2021-06-25 ENCOUNTER — Ambulatory Visit (INDEPENDENT_AMBULATORY_CARE_PROVIDER_SITE_OTHER): Payer: BC Managed Care – PPO | Admitting: Obstetrics & Gynecology

## 2021-06-25 ENCOUNTER — Inpatient Hospital Stay: Payer: BC Managed Care – PPO

## 2021-06-25 ENCOUNTER — Other Ambulatory Visit: Payer: Self-pay

## 2021-06-25 ENCOUNTER — Encounter: Payer: Self-pay | Admitting: Obstetrics & Gynecology

## 2021-06-25 VITALS — BP 100/60 | Wt 152.0 lb

## 2021-06-25 VITALS — BP 113/60 | HR 73 | Temp 97.3°F | Resp 20

## 2021-06-25 DIAGNOSIS — O99011 Anemia complicating pregnancy, first trimester: Secondary | ICD-10-CM

## 2021-06-25 DIAGNOSIS — D509 Iron deficiency anemia, unspecified: Secondary | ICD-10-CM | POA: Diagnosis not present

## 2021-06-25 DIAGNOSIS — Z9884 Bariatric surgery status: Secondary | ICD-10-CM

## 2021-06-25 DIAGNOSIS — Z3A34 34 weeks gestation of pregnancy: Secondary | ICD-10-CM

## 2021-06-25 DIAGNOSIS — Q774 Achondroplasia: Secondary | ICD-10-CM

## 2021-06-25 DIAGNOSIS — O0992 Supervision of high risk pregnancy, unspecified, second trimester: Secondary | ICD-10-CM

## 2021-06-25 MED ORDER — IRON SUCROSE 20 MG/ML IV SOLN
200.0000 mg | Freq: Once | INTRAVENOUS | Status: AC
  Administered 2021-06-25: 200 mg via INTRAVENOUS
  Filled 2021-06-25: qty 10

## 2021-06-25 MED ORDER — SODIUM CHLORIDE 0.9 % IV SOLN
Freq: Once | INTRAVENOUS | Status: AC
  Filled 2021-06-25: qty 250

## 2021-06-25 MED ORDER — SODIUM CHLORIDE 0.9 % IV SOLN: 200.0000 mg | INTRAVENOUS | Status: DC

## 2021-06-25 NOTE — Patient Instructions (Signed)

## 2021-06-25 NOTE — Patient Instructions (Signed)
Back Pain in Pregnancy Back pain during pregnancy is common. Back pain may be caused by severalfactors that are related to changes during your pregnancy. Follow these instructions at home: Managing pain, stiffness, and swelling     If directed, for sudden (acute) back pain, put ice on the painful area. Put ice in a plastic bag. Place a towel between your skin and the bag. Leave the ice on for 20 minutes, 2-3 times per day. If directed, apply heat to the affected area before you exercise. Use the heat source that your health care provider recommends, such as a moist heat pack or a heating pad. Place a towel between your skin and the heat source. Leave the heat on for 20-30 minutes. Remove the heat if your skin turns bright red. This is especially important if you are unable to feel pain, heat, or cold. You may have a greater risk of getting burned. If directed, massage the affected area. Activity Exercise as told by your health care provider. Gentle exercise is the best way to prevent or manage back pain. Listen to your body when lifting. If lifting hurts, ask for help or bend your knees. This uses your leg muscles instead of your back muscles. Squat down when picking up something from the floor. Do not bend over. Only use bed rest for short periods as told by your health care provider. Bed rest should only be used for the most severe episodes of back pain. Standing, sitting, and lying down Do not stand in one place for long periods of time. Use good posture when sitting. Make sure your head rests over your shoulders and is not hanging forward. Use a pillow on your lower back if necessary. Try sleeping on your side, preferably the left side, with a pregnancy support pillow or 1-2 regular pillows between your legs. If you have back pain after a night's rest, your bed may be too soft. A firm mattress may provide more support for your back during pregnancy. General instructions Do not wear  high heels. Eat a healthy diet. Try to gain weight within your health care provider's recommendations. Use a maternity girdle, elastic sling, or back brace as told by your health care provider. Take over-the-counter and prescription medicines only as told by your health care provider. Work with a physical therapist or massage therapist to find ways to manage back pain. Acupuncture or massage therapy may be helpful. Keep all follow-up visits as told by your health care provider. This is important. Contact a health care provider if: Your back pain interferes with your daily activities. You have increasing pain in other parts of your body. Get help right away if: You develop numbness, tingling, weakness, or problems with the use of your arms or legs. You develop severe back pain that is not controlled with medicine. You have a change in bowel or bladder control. You develop shortness of breath, dizziness, or you faint. You develop nausea, vomiting, or sweating. You have back pain that is a rhythmic, cramping pain similar to labor pains. Labor pain is usually 1-2 minutes apart, lasts for about 1 minute, and involves a bearing down feeling or pressure in your pelvis. You have back pain and your water breaks or you have vaginal bleeding. You have back pain or numbness that travels down your leg. Your back pain developed after you fell. You develop pain on one side of your back. You see blood in your urine. You develop skin blisters in the area of   your back pain. Summary Back pain may be caused by several factors that are related to changes during your pregnancy. Follow instructions as told by your health care provider for managing pain, stiffness, and swelling. Exercise as told by your health care provider. Gentle exercise is the best way to prevent or manage back pain. Take over-the-counter and prescription medicines only as told by your health care provider. Keep all follow-up visits as told  by your health care provider. This is important. This information is not intended to replace advice given to you by your health care provider. Make sure you discuss any questions you have with your healthcare provider. Document Revised: 10/12/2020 Document Reviewed: 04/14/2018 Elsevier Patient Education  2022 [employer]vier Inc.  

## 2021-06-25 NOTE — Progress Notes (Signed)
  Subjective  Fetal Movement? yes Contractions? no Leaking Fluid? no Vaginal Bleeding? no  Objective  BP 100/60   Wt 152 lb (68.9 kg)   LMP 10/23/2020   BMI 42.75 kg/m  General: NAD Pumonary: no increased work of breathing Abdomen: gravid, non-tender Extremities: no edema Psychiatric: mood appropriate, affect full  Assessment  35 y.o. G1P0000 at [redacted]w[redacted]d by  08/01/2021, by Ultrasound presenting for routine prenatal visit  Plan   Problem List Items Addressed This Visit      Musculoskeletal and Integument   Achondroplasia     Other   Supervision of high-risk pregnancy, second trimester - Primary   History of bariatric surgery  Other Visit Diagnoses    [redacted] weeks gestation of pregnancy        PNV, FMC, PTL precautions CS 07/22/21, sooner if necessary Monitor growth  pregnancy1 Problems (from 10/26/20 to present)    Problem Noted Resolved   History of bariatric surgery 01/30/2021 by Nadara Mustard, MD No   Overview Addendum 01/30/2021  3:29 PM by Nadara Mustard, MD    Discussed that a prenatal vitamin should be taken daily as well  as 60 g of protein per day and a balanced diet with an  additional 1200 mg/day of calcium citrate.  Also, we usually  recommend that serial laboratory testing be obtained each  trimester, which should consist of serum albumin, total  unionized calcium, parathyroid hormone, complete blood  count, folic acid, serum iron ferritin, and total iron binding  capacity. Levels of fat-soluble vitamins (A, D, E, and PTT as  a surigate for vitamin K) as well as B12 and 25 hydroxy  vitamin D should be checked every trimester as well.  Initial  labs demonstrated iron deficiency anemia. She is scheduled  for iron infusion in the coming week.  With respect to antenatal surveillance, reports are somewhat  conflicting with regard to infant birth weight and should be  interpreted with caution.  However, in general there is a trend  for more small for  gestational age infants in those  pregnancies after bariatric surgery.  Therefore, serial ultrasounds are recommended.      Supervision of high-risk pregnancy, second trimester 01/02/2021 by Nadara Mustard, MD No   Overview Addendum 05/07/2021 10:30 AM by Nadara Mustard, MD    Clinic [facility] Prenatal Labs  Dating LMP and [place] 7 wks Blood type: O/Positive/-- (02/23 1525)   Genetic Screen      NIPS: nml XX Antibody:Negative (02/23 1525)  Anatomic [place] MFM [place] Rubella: 1.08 (02/23 1525) Varicella: Imm  GTT  Third trimester:  RPR: Non Reactive (02/23 1525)   Rhogam n/a HBsAg: Negative (02/23 1525)   TDaP vaccine             Flu Shot:no HIV: Non Reactive (02/23 1525)   Baby Food    Breast                            GBS:   Contraception    Depo Pap:10/2020  CBB  NO   CS/VBAC N/A   Support Person Tera Mater, MD, [facility], [facility] 06/25/2021  4:38 PM

## 2021-06-26 ENCOUNTER — Inpatient Hospital Stay: Payer: BC Managed Care – PPO

## 2021-06-28 ENCOUNTER — Inpatient Hospital Stay: Payer: BC Managed Care – PPO

## 2021-07-02 ENCOUNTER — Inpatient Hospital Stay: Payer: BC Managed Care – PPO

## 2021-07-09 ENCOUNTER — Inpatient Hospital Stay: Payer: BC Managed Care – PPO

## 2021-07-09 ENCOUNTER — Other Ambulatory Visit: Payer: Self-pay

## 2021-07-09 VITALS — BP 114/70 | HR 75 | Temp 96.4°F | Resp 20

## 2021-07-09 DIAGNOSIS — D509 Iron deficiency anemia, unspecified: Secondary | ICD-10-CM | POA: Diagnosis not present

## 2021-07-09 DIAGNOSIS — O99011 Anemia complicating pregnancy, first trimester: Secondary | ICD-10-CM

## 2021-07-09 MED ORDER — IRON SUCROSE 20 MG/ML IV SOLN
200.0000 mg | Freq: Once | INTRAVENOUS | Status: AC
  Administered 2021-07-09: 200 mg via INTRAVENOUS
  Filled 2021-07-09: qty 10

## 2021-07-09 MED ORDER — SODIUM CHLORIDE 0.9 % IV SOLN: 200.0000 mg | INTRAVENOUS | Status: DC

## 2021-07-09 MED ORDER — SODIUM CHLORIDE 0.9 % IV SOLN
Freq: Once | INTRAVENOUS | Status: AC
  Filled 2021-07-09: qty 250

## 2021-07-09 NOTE — Patient Instructions (Signed)

## 2021-07-10 ENCOUNTER — Ambulatory Visit (INDEPENDENT_AMBULATORY_CARE_PROVIDER_SITE_OTHER): Payer: BC Managed Care – PPO | Admitting: Obstetrics & Gynecology

## 2021-07-10 ENCOUNTER — Encounter: Payer: Self-pay | Admitting: Obstetrics & Gynecology

## 2021-07-10 VITALS — BP 100/60 | Wt 152.0 lb

## 2021-07-10 DIAGNOSIS — Q774 Achondroplasia: Secondary | ICD-10-CM

## 2021-07-10 DIAGNOSIS — Z9884 Bariatric surgery status: Secondary | ICD-10-CM

## 2021-07-10 DIAGNOSIS — Z3A36 36 weeks gestation of pregnancy: Secondary | ICD-10-CM

## 2021-07-10 DIAGNOSIS — O0992 Supervision of high risk pregnancy, unspecified, second trimester: Secondary | ICD-10-CM

## 2021-07-10 NOTE — Patient Instructions (Signed)
Cesarean Delivery, Care After This sheet gives you information about how to care for yourself after your procedure. Your health care provider may also give you more specific instructions. If you have problems or questions, contact your health care provider. What can I expect after the procedure? After the procedure, it is common to have: A small amount of blood or clear fluid coming from the incision. Some redness, swelling, and pain in your incision area. Some abdominal pain and soreness. Vaginal bleeding (lochia). Even though you did not have a vaginal delivery, you will still have vaginal bleeding and discharge. Pelvic cramps. Fatigue. You may have pain, swelling, and discomfort in the tissue between your vagina and your anus (perineum) if: Your C-section was unplanned, and you were allowed to labor and push. An incision was made in the area (episiotomy) or the tissue tore during attempted vaginal delivery. Follow these instructions at home: Incision care  Follow instructions from your health care provider about how to take care of your incision. Make sure you: Wash your hands with soap and water before you change your bandage (dressing). If soap and water are not available, use hand sanitizer. If you have a dressing, change it or remove it as told by your health care provider. Leave stitches (sutures), skin staples, skin glue, or adhesive strips in place. These skin closures may need to stay in place for 2 weeks or longer. If adhesive strip edges start to loosen and curl up, you may trim the loose edges. Do not remove adhesive strips completely unless your health care provider tells you to do that. Check your incision area every day for signs of infection. Check for: More redness, swelling, or pain. More fluid or blood. Warmth. Pus or a bad smell. Do not take baths, swim, or use a hot tub until your health care provider says it's okay. Ask your health care provider if you can take  showers. When you cough or sneeze, hug a pillow. This helps with pain and decreases the chance of your incision opening up (dehiscing). Do this until your incision heals. Medicines Take over-the-counter and prescription medicines only as told by your health care provider. If you were prescribed an antibiotic medicine, take it as told by your health care provider. Do not stop taking the antibiotic even if you start to feel better. Do not drive or use heavy machinery while taking prescription pain medicine. Lifestyle Do not drink alcohol. This is especially important if you are breastfeeding or taking pain medicine. Do not use any products that contain nicotine or tobacco, such as cigarettes, e-cigarettes, and chewing tobacco. If you need help quitting, ask your health care provider. Eating and drinking Drink at least 8 eight-ounce glasses of water every day unless told not to by your health care provider. If you breastfeed, you may need to drink even more water. Eat high-fiber foods every day. These foods may help prevent or relieve constipation. High-fiber foods include: Whole grain cereals and breads. Brown rice. Beans. Fresh fruits and vegetables. Activity  If possible, have someone help you care for your baby and help with household activities for at least a few days after you leave the hospital. Return to your normal activities as told by your health care provider. Ask your health care provider what activities are safe for you. Rest as much as possible. Try to rest or take a nap while your baby is sleeping. Do not lift anything that is heavier than 10 lbs (4.5 kg), or the   limit that you were told, until your health care provider says that it is safe. Talk with your health care provider about when you can engage in sexual activity. This may depend on your: Risk of infection. How fast you heal. Comfort and desire to engage in sexual activity. General instructions Do not use tampons or  douches until your health care provider approves. Wear loose, comfortable clothing and a supportive and well-fitting bra. Keep your perineum clean and dry. Wipe from front to back when you use the toilet. If you pass a blood clot, save it and call your health care provider to discuss. Do not flush blood clots down the toilet before you get instructions from your health care provider. Keep all follow-up visits for you and your baby as told by your health care provider. This is important. Contact a health care provider if: You have: A fever. Bad-smelling vaginal discharge. Pus or a bad smell coming from your incision. Difficulty or pain when urinating. A sudden increase or decrease in the frequency of your bowel movements. More redness, swelling, or pain around your incision. More fluid or blood coming from your incision. A rash. Nausea. Little or no interest in activities you used to enjoy. Questions about caring for yourself or your baby. Your incision feels warm to the touch. Your breasts turn red or become painful or hard. You feel unusually sad or worried. You vomit. You pass a blood clot from your vagina. You urinate more than usual. You are dizzy or light-headed. Get help right away if: You have: Pain that does not go away or get better with medicine. Chest pain. Difficulty breathing. Blurred vision or spots in your vision. Thoughts about hurting yourself or your baby. New pain in your abdomen or in one of your legs. A severe headache. You faint. You bleed from your vagina so much that you fill more than one sanitary pad in one hour. Bleeding should not be heavier than your heaviest period. Summary After the procedure, it is common to have pain at your incision site, abdominal cramping, and slight bleeding from your vagina. Check your incision area every day for signs of infection. Tell your health care provider about any unusual symptoms. Keep all follow-up visits for  you and your baby as told by your health care provider. This information is not intended to replace advice given to you by your health care provider. Make sure you discuss any questions you have with your health care provider. Document Revised: 05/03/2018 Document Reviewed: 05/05/2018 Elsevier Patient Education  2022 [employer]  

## 2021-07-10 NOTE — Progress Notes (Signed)
PRE-OPERATIVE HISTORY AND PHYSICAL EXAM  HPI:  Gina Webster is a 35 y.o. G1P0000.  Patient's last menstrual period was 10/23/2020.  [redacted]w[redacted]d Estimated Date of Delivery: 08/01/21  She is being admitted for  personal history of having achondroplasia, and has need and necessity for cesarean delivery.  PMHx: She  has a past medical history of Anemia and Dwarf, achondroplastic. Also,  has a past surgical history that includes Gastric bypass; Mandible surgery; Leg Surgery (Bilateral); and Tympanostomy tube placement., family history includes Alcohol abuse in her father; COPD in her maternal grandmother; Heart disease in her father.,  reports that she has never smoked. She has never used smokeless tobacco. She reports that she does not drink alcohol and does not use drugs. OB History  Gravida Para Term Preterm AB Living  1 0 0 0 0 0  SAB IAB Ectopic Multiple Live Births  0 0 0 0 0    # Outcome Date GA Lbr Len/2nd Weight Sex Delivery Anes PTL Lv  1 Current           Patient denies any other pertinent gynecologic issues. See prenatal record for more complete H&P   Current Outpatient Medications:    Accu-Chek Softclix Lancets lancets, 1 each by Other route 4 (four) times daily., Disp: 100 each, Rfl: 12   acetaminophen (TYLENOL) 325 MG tablet, Take 650 mg by mouth every 6 (six) hours as needed for moderate pain., Disp: , Rfl:    Blood Glucose Calibration (ACCU-CHEK AVIVA) SOLN, 1 KIT BY SUBDERMAL ROUTE AS DIRECTED. CHECK BLOOD SUGARS FOR FASTING, AND TWO HOURS AFTER BREAKFAST, LUNCH AND DINNER (4 CHECKS DAILY). DO THIS FOR 5 DAYS PRIOR TO NEXT VISIT., Disp: 1 each, Rfl: 0   calcium citrate (CALCITRATE - DOSED IN MG ELEMENTAL CALCIUM) 950 (200 Ca) MG tablet, Take 600 mg of elemental calcium by mouth daily., Disp: , Rfl:    clotrimazole-betamethasone (LOTRISONE) cream, Apply 1 application topically 2 (two) times daily., Disp: 30 g, Rfl: 0   ferrous sulfate 325 (65 FE) MG tablet, Take 325 mg by  mouth daily with breakfast., Disp: , Rfl:    glucose blood (ACCU-CHEK SMARTVIEW) test strip, Use as instructed to check blood sugars 4 times daily, Disp: 100 each, Rfl: 12   prenatal vitamin w/FE, FA (NATACHEW) 29-1 MG CHEW chewable tablet, Chew 2 tablets by mouth daily at 12 noon., Disp: , Rfl:  Also, is allergic to sulfa antibiotics.  Review of Systems  Constitutional:  Negative for chills, fever and malaise/fatigue.  HENT:  Negative for congestion, sinus pain and sore throat.   Eyes:  Negative for blurred vision and pain.  Respiratory:  Negative for cough and wheezing.   Cardiovascular:  Negative for chest pain and leg swelling.  Gastrointestinal:  Negative for abdominal pain, constipation, diarrhea, heartburn, nausea and vomiting.  Genitourinary:  Negative for dysuria, frequency, hematuria and urgency.  Musculoskeletal:  Negative for back pain, joint pain, myalgias and neck pain.  Skin:  Negative for itching and rash.  Neurological:  Negative for dizziness, tremors and weakness.  Endo/Heme/Allergies:  Does not bruise/bleed easily.  Psychiatric/Behavioral:  Negative for depression. The patient is not nervous/anxious and does not have insomnia.    Objective: BP 100/60   Wt 152 lb (68.9 kg)   LMP 10/23/2020   BMI 42.75 kg/m  Filed Weights   07/10/21 1423  Weight: 152 lb (68.9 kg)   Physical Exam Constitutional:      General: She is not in  acute distress.    Appearance: She is well-developed.  HENT:     Head: Normocephalic and atraumatic. No laceration.     Right Ear: Hearing normal.     Left Ear: Hearing normal.     Mouth/Throat:     Pharynx: Uvula midline.  Eyes:     Pupils: Pupils are equal, round, and reactive to light.  Neck:     Thyroid: No thyromegaly.  Cardiovascular:     Rate and Rhythm: Normal rate and regular rhythm.     Heart sounds: No murmur heard.   No friction rub. No gallop.  Pulmonary:     Effort: Pulmonary effort is normal. No respiratory distress.      Breath sounds: Normal breath sounds. No wheezing.  Abdominal:     General: Bowel sounds are normal. There is no distension.     Palpations: Abdomen is soft.     Tenderness: There is no abdominal tenderness. There is no rebound.     Comments: FHT 130s  Musculoskeletal:        General: Normal range of motion.     Cervical back: Normal range of motion and neck supple.  Neurological:     Mental Status: She is alert and oriented to person, place, and time.     Cranial Nerves: No cranial nerve deficit.  Skin:    General: Skin is warm and dry.  Psychiatric:        Judgment: Judgment normal.  Vitals reviewed.    Assessment: 1. Supervision of high-risk pregnancy, second trimester   2. History of bariatric surgery   3. Achondroplasia   4. [redacted] weeks gestation of pregnancy   Plan CS at 81 weeks; unable to have vaginal delivery based on risks of short stature and pelvic size (achondroplasia)  PLAN: 1.  Cesarean Delivery as Scheduled.  Patient will undergo surgical management with Cesarean Section.   The risks of surgery were discussed in detail with the patient including but not limited to: bleeding which may require transfusion or reoperation; infection which may require antibiotics; injury to surrounding organs which may involve bowel, bladder, ureters ; need for additional procedures including laparoscopy or laparotomy; thromboembolic phenomenon, surgical site problems and other postoperative/anesthesia complications. Likelihood of success in alleviating the patient's condition was discussed. Routine postoperative instructions will be reviewed with the patient and her family in detail after surgery.  The patient concurred with the proposed plan, giving informed written consent for the surgery.  Patient will be NPO procedure.  Preoperative prophylactic antibiotics, as necessary, and SCDs ordered on call to the OR.   Barnett Applebaum, M.D. 07/10/2021 2:42 PM

## 2021-07-10 NOTE — H&P (View-Only) (Signed)
PRE-OPERATIVE HISTORY AND PHYSICAL EXAM  HPI:  Gina Webster is a 35 y.o. G1P0000.  Patient's last menstrual period was 10/23/2020.  107w6d Estimated Date of Delivery: 08/01/21  She is being admitted for  personal history of having achondroplasia, and has need and necessity for cesarean delivery.  PMHx: She  has a past medical history of Anemia and Dwarf, achondroplastic. Also,  has a past surgical history that includes Gastric bypass; Mandible surgery; Leg Surgery (Bilateral); and Tympanostomy tube placement., family history includes Alcohol abuse in her father; COPD in her maternal grandmother; Heart disease in her father.,  reports that she has never smoked. She has never used smokeless tobacco. She reports that she does not drink alcohol and does not use drugs. OB History  Gravida Para Term Preterm AB Living  1 0 0 0 0 0  SAB IAB Ectopic Multiple Live Births  0 0 0 0 0    # Outcome Date GA Lbr Len/2nd Weight Sex Delivery Anes PTL Lv  1 Current           Patient denies any other pertinent gynecologic issues. See prenatal record for more complete H&P   Current Outpatient Medications:    Accu-Chek Softclix Lancets lancets, 1 each by Other route 4 (four) times daily., Disp: 100 each, Rfl: 12   acetaminophen (TYLENOL) 325 MG tablet, Take 650 mg by mouth every 6 (six) hours as needed for moderate pain., Disp: , Rfl:    Blood Glucose Calibration (ACCU-CHEK AVIVA) SOLN, 1 KIT BY SUBDERMAL ROUTE AS DIRECTED. CHECK BLOOD SUGARS FOR FASTING, AND TWO HOURS AFTER BREAKFAST, LUNCH AND DINNER (4 CHECKS DAILY). DO THIS FOR 5 DAYS PRIOR TO NEXT VISIT., Disp: 1 each, Rfl: 0   calcium citrate (CALCITRATE - DOSED IN MG ELEMENTAL CALCIUM) 950 (200 Ca) MG tablet, Take 600 mg of elemental calcium by mouth daily., Disp: , Rfl:    clotrimazole-betamethasone (LOTRISONE) cream, Apply 1 application topically 2 (two) times daily., Disp: 30 g, Rfl: 0   ferrous sulfate 325 (65 FE) MG tablet, Take 325 mg by  mouth daily with breakfast., Disp: , Rfl:    glucose blood (ACCU-CHEK SMARTVIEW) test strip, Use as instructed to check blood sugars 4 times daily, Disp: 100 each, Rfl: 12   prenatal vitamin w/FE, FA (NATACHEW) 29-1 MG CHEW chewable tablet, Chew 2 tablets by mouth daily at 12 noon., Disp: , Rfl:  Also, is allergic to sulfa antibiotics.  Review of Systems  Constitutional:  Negative for chills, fever and malaise/fatigue.  HENT:  Negative for congestion, sinus pain and sore throat.   Eyes:  Negative for blurred vision and pain.  Respiratory:  Negative for cough and wheezing.   Cardiovascular:  Negative for chest pain and leg swelling.  Gastrointestinal:  Negative for abdominal pain, constipation, diarrhea, heartburn, nausea and vomiting.  Genitourinary:  Negative for dysuria, frequency, hematuria and urgency.  Musculoskeletal:  Negative for back pain, joint pain, myalgias and neck pain.  Skin:  Negative for itching and rash.  Neurological:  Negative for dizziness, tremors and weakness.  Endo/Heme/Allergies:  Does not bruise/bleed easily.  Psychiatric/Behavioral:  Negative for depression. The patient is not nervous/anxious and does not have insomnia.    Objective: BP 100/60   Wt 152 lb (68.9 kg)   LMP 10/23/2020   BMI 42.75 kg/m  Filed Weights   07/10/21 1423  Weight: 152 lb (68.9 kg)   Physical Exam Constitutional:      General: She is not in  acute distress.    Appearance: She is well-developed.  HENT:     Head: Normocephalic and atraumatic. No laceration.     Right Ear: Hearing normal.     Left Ear: Hearing normal.     Mouth/Throat:     Pharynx: Uvula midline.  Eyes:     Pupils: Pupils are equal, round, and reactive to light.  Neck:     Thyroid: No thyromegaly.  Cardiovascular:     Rate and Rhythm: Normal rate and regular rhythm.     Heart sounds: No murmur heard.   No friction rub. No gallop.  Pulmonary:     Effort: Pulmonary effort is normal. No respiratory distress.      Breath sounds: Normal breath sounds. No wheezing.  Abdominal:     General: Bowel sounds are normal. There is no distension.     Palpations: Abdomen is soft.     Tenderness: There is no abdominal tenderness. There is no rebound.     Comments: FHT 130s  Musculoskeletal:        General: Normal range of motion.     Cervical back: Normal range of motion and neck supple.  Neurological:     Mental Status: She is alert and oriented to person, place, and time.     Cranial Nerves: No cranial nerve deficit.  Skin:    General: Skin is warm and dry.  Psychiatric:        Judgment: Judgment normal.  Vitals reviewed.    Assessment: 1. Supervision of high-risk pregnancy, second trimester   2. History of bariatric surgery   3. Achondroplasia   4. [redacted] weeks gestation of pregnancy   Plan CS at 4 weeks; unable to have vaginal delivery based on risks of short stature and pelvic size (achondroplasia)  PLAN: 1.  Cesarean Delivery as Scheduled.  Patient will undergo surgical management with Cesarean Section.   The risks of surgery were discussed in detail with the patient including but not limited to: bleeding which may require transfusion or reoperation; infection which may require antibiotics; injury to surrounding organs which may involve bowel, bladder, ureters ; need for additional procedures including laparoscopy or laparotomy; thromboembolic phenomenon, surgical site problems and other postoperative/anesthesia complications. Likelihood of success in alleviating the patient's condition was discussed. Routine postoperative instructions will be reviewed with the patient and her family in detail after surgery.  The patient concurred with the proposed plan, giving informed written consent for the surgery.  Patient will be NPO procedure.  Preoperative prophylactic antibiotics, as necessary, and SCDs ordered on call to the OR.   Barnett Applebaum, M.D. 07/10/2021 2:42 PM

## 2021-07-11 ENCOUNTER — Inpatient Hospital Stay: Payer: BC Managed Care – PPO | Attending: Oncology

## 2021-07-11 ENCOUNTER — Encounter
Admission: RE | Admit: 2021-07-11 | Discharge: 2021-07-11 | Disposition: A | Discharge status: Home / Self Care | Payer: BC Managed Care – PPO | Source: Ambulatory Visit | Attending: Obstetrics & Gynecology | Admitting: Obstetrics & Gynecology

## 2021-07-11 ENCOUNTER — Other Ambulatory Visit: Payer: Self-pay

## 2021-07-11 VITALS — BP 119/79 | HR 74 | Resp 16

## 2021-07-11 DIAGNOSIS — D509 Iron deficiency anemia, unspecified: Secondary | ICD-10-CM | POA: Diagnosis not present

## 2021-07-11 DIAGNOSIS — O99011 Anemia complicating pregnancy, first trimester: Secondary | ICD-10-CM

## 2021-07-11 MED ORDER — SODIUM CHLORIDE 0.9 % IV SOLN: 200.0000 mg | INTRAVENOUS | Status: DC

## 2021-07-11 MED ORDER — SODIUM CHLORIDE 0.9 % IV SOLN
Freq: Once | INTRAVENOUS | Status: AC
  Filled 2021-07-11: qty 250

## 2021-07-11 MED ORDER — IRON SUCROSE 20 MG/ML IV SOLN
200.0000 mg | Freq: Once | INTRAVENOUS | Status: AC
  Administered 2021-07-11: 200 mg via INTRAVENOUS
  Filled 2021-07-11: qty 10

## 2021-07-11 NOTE — Patient Instructions (Signed)

## 2021-07-11 NOTE — Patient Instructions (Addendum)
Your procedure is scheduled on:07-22-21 Monday Report to the [facility] Mall-Arrive @ 5:30 AM (Please see attached instructions with # to call once you arrive to hospital)  REMEMBER: Instructions that are not followed completely may result in serious medical risk, up to and including death; or upon the discretion of your surgeon and anesthesiologist your surgery may need to be rescheduled.  Do not eat food after midnight the night before surgery.  No gum chewing, lozengers or hard candies.  You may however, drink CLEAR liquids up to 2 hours before you are scheduled to arrive for your surgery. Do not drink anything within 2 hours of your scheduled arrival time.  Clear liquids include: - water  - apple juice without pulp - gatorade (not RED, PURPLE, OR BLUE) - black coffee or tea (Do NOT add milk or creamers to the coffee or tea) Do NOT drink anything that is not on this list.  Do not take any medication the day of surgery  One week prior to surgery: Stop Anti-inflammatories (NSAIDS) such as Advil, Aleve, Ibuprofen, Motrin, Naproxen, Naprosyn and Aspirin based products such as Excedrin, Goodys Powder, BC Powder.You may however, continue to take Tylenol if needed for pain up until the day of surgery.  No Alcohol for 24 hours before or after surgery.  No Smoking including e-cigarettes for 24 hours prior to surgery.  No chewable tobacco products for at least 6 hours prior to surgery.  No nicotine patches on the day of surgery.  Do not use any "recreational" drugs for at least a week prior to your surgery.  Please be advised that the combination of cocaine and anesthesia may have negative outcomes, up to and including death. If you test positive for cocaine, your surgery will be cancelled.  On the morning of surgery brush your teeth with toothpaste and water, you may rinse your mouth with mouthwash if you wish. Do not swallow any toothpaste or mouthwash.  Do not wear jewelry, make-up,  hairpins, clips or nail polish.  Do not wear lotions, powders, or perfumes.   Do not shave body from the neck down 48 hours prior to surgery just in case you cut yourself which could leave a site for infection.  Also, freshly shaved skin may become irritated if using the CHG soap.  Contact lenses, hearing aids and dentures may not be worn into surgery.  Do not bring valuables to the hospital. [facility] is not responsible for any missing/lost belongings or valuables.   Use CHG Soap as directed on instruction sheet (Avoid nipple and private area-use regular soap for these areas)  Notify your doctor if there is any change in your medical condition (cold, fever, infection).  Wear comfortable clothing (specific to your surgery type) to the hospital.  After surgery, you can help prevent lung complications by doing breathing exercises.  Take deep breaths and cough every 1-2 hours. Your doctor may order a device called an Incentive Spirometer to help you take deep breaths. When coughing or sneezing, hold a pillow firmly against your incision with both hands. This is called "splinting." Doing this helps protect your incision. It also decreases belly discomfort.  If you are being admitted to the hospital overnight, leave your suitcase in the car. After surgery it may be brought to your room.  If you are being discharged the day of surgery, you will not be allowed to drive home. You will need a responsible adult (18 years or older) to drive you home and stay with  you that night.   If you are taking public transportation, you will need to have a responsible adult (18 years or older) with you. Please confirm with your physician that it is acceptable to use public transportation.   Please call the Pre-admissions Testing Dept. at [phone] if you have any questions about these instructions.  Surgery Visitation Policy:  Patients undergoing a surgery or procedure may have one family member or  support person with them as long as that person is not COVID-19 positive or experiencing its symptoms.  That person may remain in the waiting area during the procedure.  Inpatient Visitation:    Visiting hours are 7 a.m. to 8 p.m. Inpatients will be allowed two visitors daily. The visitors may change each day during the patient's stay. No visitors under the age of 49. Any visitor under the age of 63 must be accompanied by an adult. The visitor must pass COVID-19 screenings, use hand sanitizer when entering and exiting the patient's room and wear a mask at all times, including in the patient's room. Patients must also wear a mask when staff or their visitor are in the room. Masking is required regardless of vaccination status.

## 2021-07-12 ENCOUNTER — Other Ambulatory Visit: Payer: Self-pay | Admitting: Maternal & Fetal Medicine

## 2021-07-12 DIAGNOSIS — O99891 Other specified diseases and conditions complicating pregnancy: Secondary | ICD-10-CM

## 2021-07-12 DIAGNOSIS — O99213 Obesity complicating pregnancy, third trimester: Secondary | ICD-10-CM

## 2021-07-16 ENCOUNTER — Ambulatory Visit: Payer: BC Managed Care – PPO | Attending: Maternal & Fetal Medicine

## 2021-07-16 ENCOUNTER — Other Ambulatory Visit: Payer: Self-pay

## 2021-07-16 ENCOUNTER — Encounter: Payer: Self-pay | Admitting: Advanced Practice Midwife

## 2021-07-16 ENCOUNTER — Ambulatory Visit (INDEPENDENT_AMBULATORY_CARE_PROVIDER_SITE_OTHER): Payer: BC Managed Care – PPO | Admitting: Advanced Practice Midwife

## 2021-07-16 VITALS — BP 100/60 | Wt 153.0 lb

## 2021-07-16 VITALS — BP 105/72 | HR 80 | Temp 98.6°F | Ht <= 58 in | Wt 153.5 lb

## 2021-07-16 DIAGNOSIS — O99013 Anemia complicating pregnancy, third trimester: Secondary | ICD-10-CM

## 2021-07-16 DIAGNOSIS — O09523 Supervision of elderly multigravida, third trimester: Secondary | ICD-10-CM | POA: Diagnosis not present

## 2021-07-16 DIAGNOSIS — D649 Anemia, unspecified: Secondary | ICD-10-CM

## 2021-07-16 DIAGNOSIS — E669 Obesity, unspecified: Secondary | ICD-10-CM

## 2021-07-16 DIAGNOSIS — Q774 Achondroplasia: Secondary | ICD-10-CM

## 2021-07-16 DIAGNOSIS — O0992 Supervision of high risk pregnancy, unspecified, second trimester: Secondary | ICD-10-CM

## 2021-07-16 DIAGNOSIS — Z3A37 37 weeks gestation of pregnancy: Secondary | ICD-10-CM

## 2021-07-16 DIAGNOSIS — O99891 Other specified diseases and conditions complicating pregnancy: Secondary | ICD-10-CM

## 2021-07-16 DIAGNOSIS — O99213 Obesity complicating pregnancy, third trimester: Secondary | ICD-10-CM

## 2021-07-16 DIAGNOSIS — O26893 Other specified pregnancy related conditions, third trimester: Secondary | ICD-10-CM | POA: Insufficient documentation

## 2021-07-16 DIAGNOSIS — O99843 Bariatric surgery status complicating pregnancy, third trimester: Secondary | ICD-10-CM

## 2021-07-16 DIAGNOSIS — Z9884 Bariatric surgery status: Secondary | ICD-10-CM

## 2021-07-16 LAB — POCT URINALYSIS DIPSTICK OB
Glucose, UA: NEGATIVE
POC,PROTEIN,UA: NEGATIVE

## 2021-07-16 NOTE — Progress Notes (Signed)
Routine Prenatal Care Visit  Subjective  Gina Webster is a 35 y.o. G1P0000 at [redacted]w[redacted]d being seen today for ongoing prenatal care.  She is currently monitored for the following issues for this high-risk pregnancy and has Dyspareunia due to medical condition in female; HPV in female; Achondroplasia; Supervision of high-risk pregnancy, second trimester; Iron deficiency anemia secondary to inadequate dietary iron intake; Anemia affecting pregnancy in first trimester; History of bariatric surgery; and Obesity affecting pregnancy in third trimester on their problem list.  ----------------------------------------------------------------------------------- Patient reports no complaints.   Contractions: Not present. Vag. Bleeding: None.  Movement: Present. Leaking Fluid denies.  ----------------------------------------------------------------------------------- The following portions of the patient's history were reviewed and updated as appropriate: allergies, current medications, past family history, past medical history, past social history, past surgical history and problem list. Problem list updated.  Objective  Blood pressure 100/60, weight 153 lb (69.4 kg), last menstrual period 10/23/2020. Pregravid weight 140 lb (63.5 kg) Total Weight Gain 13 lb (5.897 kg) Urinalysis: Urine Protein Negative  Urine Glucose Negative  Fetal Status: Fetal Heart Rate (bpm): 123 Fundal Height: 38 cm Movement: Present     General:  Alert, oriented and cooperative. Patient is in no acute distress.  Skin: Skin is warm and dry. No rash noted.   Cardiovascular: Normal heart rate noted  Respiratory: Normal respiratory effort, no problems with respiration noted  Abdomen: Soft, gravid, appropriate for gestational age. Pain/Pressure: Present     Pelvic:  Cervical exam deferred        Extremities: Normal range of motion.     Mental Status: Normal mood and affect. Normal behavior. Normal judgment and thought content.    Assessment   35 y.o. G1P0000 at [redacted]w[redacted]d by  08/01/2021, by Ultrasound presenting for routine prenatal visit  Plan   pregnancy1 Problems (from 10/26/20 to present)    Problem Noted Resolved   Obesity affecting pregnancy in third trimester 07/16/2021 by Tresea Mall, CNM No   History of bariatric surgery 01/30/2021 by Nadara Mustard, MD No   Overview Addendum 01/30/2021  3:29 PM by Nadara Mustard, MD    Discussed that a prenatal vitamin should be taken daily as well  as 60 g of protein per day and a balanced diet with an  additional 1200 mg/day of calcium citrate.  Also, we usually  recommend that serial laboratory testing be obtained each  trimester, which should consist of serum albumin, total  unionized calcium, parathyroid hormone, complete blood  count, folic acid, serum iron ferritin, and total iron binding  capacity. Levels of fat-soluble vitamins (A, D, E, and PTT as  a surigate for vitamin K) as well as B12 and 25 hydroxy  vitamin D should be checked every trimester as well.  Initial  labs demonstrated iron deficiency anemia. She is scheduled  for iron infusion in the coming week.  With respect to antenatal surveillance, reports are somewhat  conflicting with regard to infant birth weight and should be  interpreted with caution.  However, in general there is a trend  for more small for gestational age infants in those  pregnancies after bariatric surgery.  Therefore, serial ultrasounds are recommended.      Supervision of high-risk pregnancy, second trimester 01/02/2021 by Nadara Mustard, MD No   Overview Addendum 05/07/2021 10:30 AM by Nadara Mustard, MD    [facility] Prenatal Labs  Dating LMP and [place] 7 wks Blood type: O/Positive/-- (02/23 1525)   Genetic Screen      NIPS:  nml XX Antibody:Negative (02/23 1525)  Anatomic [place] MFM [place] Rubella: 1.08 (02/23 1525) Varicella: Imm  GTT  Third trimester:  RPR: Non Reactive (02/23 1525)   Rhogam n/a HBsAg: Negative (02/23  1525)   TDaP vaccine  8/2           Flu Shot:no HIV: Non Reactive (02/23 1525)   Baby Food    Breast                            GBS:   Contraception    Depo Pap:10/2020  CBB  NO   CS/VBAC N/A   Support Person Creola Corn               Preterm labor symptoms and general obstetric precautions including but not limited to vaginal bleeding, contractions, leaking of fluid and fetal movement were reviewed in detail with the patient.     Return for scheduled follow up.  Tresea Mall, CNM 07/16/2021 3:57 PM

## 2021-07-19 ENCOUNTER — Other Ambulatory Visit: Payer: Self-pay

## 2021-07-19 ENCOUNTER — Inpatient Hospital Stay: Admission: RE | Admit: 2021-07-19 | Payer: BC Managed Care – PPO | Source: Ambulatory Visit

## 2021-07-19 ENCOUNTER — Encounter: Payer: Self-pay | Admitting: Urgent Care

## 2021-07-19 ENCOUNTER — Encounter
Admission: RE | Admit: 2021-07-19 | Discharge: 2021-07-19 | Disposition: A | Discharge status: Home / Self Care | Payer: BC Managed Care – PPO | Source: Ambulatory Visit | Attending: Obstetrics & Gynecology | Admitting: Obstetrics & Gynecology

## 2021-07-19 DIAGNOSIS — Z20822 Contact with and (suspected) exposure to covid-19: Secondary | ICD-10-CM | POA: Insufficient documentation

## 2021-07-19 DIAGNOSIS — Z01812 Encounter for preprocedural laboratory examination: Secondary | ICD-10-CM | POA: Insufficient documentation

## 2021-07-19 LAB — CBC
HCT: 38.7 % (ref 36.0–46.0)
Hemoglobin: 13.5 g/dL (ref 12.0–15.0)
MCH: 32.2 pg (ref 26.0–34.0)
MCHC: 34.9 g/dL (ref 30.0–36.0)
MCV: 92.4 fL (ref 80.0–100.0)
Platelets: 144 10*3/uL — ABNORMAL LOW (ref 150–400)
RBC: 4.19 MIL/uL (ref 3.87–5.11)
RDW: 14.1 % (ref 11.5–15.5)
WBC: 7.6 10*3/uL (ref 4.0–10.5)
nRBC: 0 % (ref 0.0–0.2)

## 2021-07-19 LAB — TYPE AND SCREEN
ABO/RH(D): O POS
Antibody Screen: NEGATIVE
Extend sample reason: UNDETERMINED

## 2021-07-19 LAB — SARS CORONAVIRUS 2 (TAT 6-24 HRS): SARS Coronavirus 2: NEGATIVE

## 2021-07-22 ENCOUNTER — Encounter
Admission: RE | Disposition: A | Discharge status: Home / Self Care | Payer: Self-pay | Source: Home / Self Care | Attending: Obstetrics & Gynecology

## 2021-07-22 ENCOUNTER — Encounter: Payer: Self-pay | Admitting: Oncology

## 2021-07-22 ENCOUNTER — Inpatient Hospital Stay: Payer: BC Managed Care – PPO | Admitting: Anesthesiology

## 2021-07-22 ENCOUNTER — Other Ambulatory Visit: Payer: Self-pay

## 2021-07-22 ENCOUNTER — Inpatient Hospital Stay
Admission: RE | Admit: 2021-07-22 | Discharge: 2021-07-24 | DRG: 788 | Disposition: A | Discharge status: Home / Self Care | Payer: BC Managed Care – PPO | Attending: Obstetrics & Gynecology | Admitting: Obstetrics & Gynecology

## 2021-07-22 ENCOUNTER — Encounter: Payer: Self-pay | Admitting: Obstetrics & Gynecology

## 2021-07-22 DIAGNOSIS — O99844 Bariatric surgery status complicating childbirth: Secondary | ICD-10-CM | POA: Diagnosis present

## 2021-07-22 DIAGNOSIS — Z3A38 38 weeks gestation of pregnancy: Secondary | ICD-10-CM

## 2021-07-22 DIAGNOSIS — O99213 Obesity complicating pregnancy, third trimester: Secondary | ICD-10-CM | POA: Diagnosis present

## 2021-07-22 DIAGNOSIS — Z20822 Contact with and (suspected) exposure to covid-19: Secondary | ICD-10-CM | POA: Diagnosis present

## 2021-07-22 DIAGNOSIS — O99892 Other specified diseases and conditions complicating childbirth: Principal | ICD-10-CM | POA: Diagnosis present

## 2021-07-22 DIAGNOSIS — O99891 Other specified diseases and conditions complicating pregnancy: Secondary | ICD-10-CM

## 2021-07-22 DIAGNOSIS — O0992 Supervision of high risk pregnancy, unspecified, second trimester: Secondary | ICD-10-CM

## 2021-07-22 DIAGNOSIS — Z882 Allergy status to sulfonamides status: Secondary | ICD-10-CM

## 2021-07-22 DIAGNOSIS — Q774 Achondroplasia: Secondary | ICD-10-CM

## 2021-07-22 DIAGNOSIS — O99214 Obesity complicating childbirth: Secondary | ICD-10-CM | POA: Diagnosis present

## 2021-07-22 DIAGNOSIS — O321XX0 Maternal care for breech presentation, not applicable or unspecified: Secondary | ICD-10-CM | POA: Diagnosis present

## 2021-07-22 SURGERY — Surgical Case
Anesthesia: Spinal

## 2021-07-22 MED ORDER — SOD CITRATE-CITRIC ACID 500-334 MG/5ML PO SOLN
ORAL | Status: AC
  Administered 2021-07-22: 30 mL via ORAL
  Filled 2021-07-22: qty 15

## 2021-07-22 MED ORDER — OXYTOCIN-SODIUM CHLORIDE 30-0.9 UT/500ML-% IV SOLN
INTRAVENOUS | Status: DC | PRN
  Administered 2021-07-22: 300 mL via INTRAVENOUS

## 2021-07-22 MED ORDER — OXYCODONE HCL 5 MG PO TABS: 5.0000 mg | ORAL_TABLET | ORAL | Status: DC | PRN

## 2021-07-22 MED ORDER — SIMETHICONE 80 MG PO CHEW
80.0000 mg | CHEWABLE_TABLET | ORAL | Status: DC | PRN
  Administered 2021-07-23: 80 mg via ORAL
  Filled 2021-07-22: qty 1

## 2021-07-22 MED ORDER — DIPHENHYDRAMINE HCL 25 MG PO CAPS: 25.0000 mg | ORAL_CAPSULE | Freq: Four times a day (QID) | ORAL | Status: DC | PRN

## 2021-07-22 MED ORDER — OXYCODONE HCL 5 MG PO TABS
5.0000 mg | ORAL_TABLET | ORAL | Status: DC | PRN
Start: 2021-07-22 — End: 2021-07-24

## 2021-07-22 MED ORDER — BUPIVACAINE HCL 0.5 % IJ SOLN
10.0000 mL | Freq: Once | INTRAMUSCULAR | Status: DC
  Filled 2021-07-22: qty 10

## 2021-07-22 MED ORDER — ZOLPIDEM TARTRATE 5 MG PO TABS: 5.0000 mg | ORAL_TABLET | Freq: Every evening | ORAL | Status: DC | PRN

## 2021-07-22 MED ORDER — KETOROLAC TROMETHAMINE 30 MG/ML IJ SOLN
30.0000 mg | Freq: Four times a day (QID) | INTRAMUSCULAR | Status: AC
  Administered 2021-07-22 – 2021-07-23 (×3): 30 mg via INTRAVENOUS
  Filled 2021-07-22 (×4): qty 1

## 2021-07-22 MED ORDER — FENTANYL CITRATE (PF) 100 MCG/2ML IJ SOLN
INTRAMUSCULAR | Status: DC | PRN
  Administered 2021-07-22: 100 ug via EPIDURAL

## 2021-07-22 MED ORDER — IBUPROFEN 600 MG PO TABS
600.0000 mg | ORAL_TABLET | Freq: Four times a day (QID) | ORAL | Status: DC
  Administered 2021-07-23 – 2021-07-24 (×4): 600 mg via ORAL
  Filled 2021-07-22 (×4): qty 1

## 2021-07-22 MED ORDER — BUPIVACAINE ON-Q PAIN PUMP (FOR ORDER SET NO CHG): INJECTION | Status: DC

## 2021-07-22 MED ORDER — LIDOCAINE HCL 2 % IJ SOLN
INTRAMUSCULAR | Status: AC
  Filled 2021-07-22: qty 10

## 2021-07-22 MED ORDER — MENTHOL 3 MG MT LOZG
1.0000 | LOZENGE | OROMUCOSAL | Status: DC | PRN
  Filled 2021-07-22: qty 9

## 2021-07-22 MED ORDER — BUPIVACAINE 0.25 % ON-Q PUMP DUAL CATH 400 ML
400.0000 mL | INJECTION | Status: DC
  Filled 2021-07-22: qty 400

## 2021-07-22 MED ORDER — BUPIVACAINE HCL (PF) 0.5 % IJ SOLN
INTRAMUSCULAR | Status: DC | PRN
  Administered 2021-07-22: 10 mL

## 2021-07-22 MED ORDER — OXYTOCIN-SODIUM CHLORIDE 30-0.9 UT/500ML-% IV SOLN: 2.5000 [IU]/h | INTRAVENOUS | Status: DC

## 2021-07-22 MED ORDER — LACTATED RINGERS IV SOLN: Freq: Once | INTRAVENOUS | Status: DC

## 2021-07-22 MED ORDER — DIBUCAINE (PERIANAL) 1 % EX OINT: 1.0000 "application " | TOPICAL_OINTMENT | CUTANEOUS | Status: DC | PRN

## 2021-07-22 MED ORDER — LIDOCAINE HCL (PF) 2 % IJ SOLN
INTRAMUSCULAR | Status: DC | PRN
  Administered 2021-07-22 (×2): 80 mg via EPIDURAL
  Administered 2021-07-22: 20 mg via EPIDURAL
  Administered 2021-07-22 (×2): 40 mg via EPIDURAL

## 2021-07-22 MED ORDER — PHENYLEPHRINE HCL (PRESSORS) 10 MG/ML IV SOLN
INTRAVENOUS | Status: DC | PRN
  Administered 2021-07-22 (×2): 100 ug via INTRAVENOUS

## 2021-07-22 MED ORDER — ACETAMINOPHEN 325 MG PO TABS
650.0000 mg | ORAL_TABLET | Freq: Four times a day (QID) | ORAL | Status: DC
  Administered 2021-07-22 (×2): 650 mg via ORAL
  Filled 2021-07-22 (×2): qty 2

## 2021-07-22 MED ORDER — BUPIVACAINE HCL (PF) 0.5 % IJ SOLN
INTRAMUSCULAR | Status: AC
  Administered 2021-07-22: 10 mL
  Filled 2021-07-22: qty 30

## 2021-07-22 MED ORDER — LACTATED RINGERS IV SOLN
INTRAVENOUS | Status: DC
Start: 2021-07-22 — End: 2021-07-22

## 2021-07-22 MED ORDER — COCONUT OIL OIL
1.0000 "application " | TOPICAL_OIL | Status: DC | PRN
  Administered 2021-07-22: 1 via TOPICAL
  Filled 2021-07-22: qty 120

## 2021-07-22 MED ORDER — LACTATED RINGERS IV SOLN: Freq: Once | INTRAVENOUS | Status: AC

## 2021-07-22 MED ORDER — MORPHINE SULFATE (PF) 0.5 MG/ML IJ SOLN
INTRAMUSCULAR | Status: AC
  Filled 2021-07-22: qty 10

## 2021-07-22 MED ORDER — SIMETHICONE 80 MG PO CHEW
80.0000 mg | CHEWABLE_TABLET | Freq: Three times a day (TID) | ORAL | Status: DC
  Administered 2021-07-22 – 2021-07-24 (×6): 80 mg via ORAL
  Filled 2021-07-22 (×6): qty 1

## 2021-07-22 MED ORDER — SENNOSIDES-DOCUSATE SODIUM 8.6-50 MG PO TABS
2.0000 | ORAL_TABLET | ORAL | Status: DC
  Administered 2021-07-22 – 2021-07-23 (×2): 2 via ORAL
  Filled 2021-07-22 (×2): qty 2

## 2021-07-22 MED ORDER — POVIDONE-IODINE 10 % EX SWAB
2.0000 "application " | Freq: Once | CUTANEOUS | Status: AC
  Administered 2021-07-22: 2 via TOPICAL

## 2021-07-22 MED ORDER — SODIUM CHLORIDE 0.9 % IV SOLN
INTRAVENOUS | Status: DC | PRN
  Administered 2021-07-22: 25 ug/min via INTRAVENOUS

## 2021-07-22 MED ORDER — LACTATED RINGERS IV SOLN: INTRAVENOUS | Status: DC

## 2021-07-22 MED ORDER — WITCH HAZEL-GLYCERIN EX PADS: 1.0000 "application " | MEDICATED_PAD | CUTANEOUS | Status: DC | PRN

## 2021-07-22 MED ORDER — OXYCODONE HCL 5 MG PO TABS
10.0000 mg | ORAL_TABLET | ORAL | Status: DC | PRN
  Administered 2021-07-22 – 2021-07-24 (×8): 10 mg via ORAL
  Filled 2021-07-22 (×8): qty 2

## 2021-07-22 MED ORDER — KETOROLAC TROMETHAMINE 30 MG/ML IJ SOLN
INTRAMUSCULAR | Status: AC
  Filled 2021-07-22: qty 1

## 2021-07-22 MED ORDER — FENTANYL CITRATE (PF) 100 MCG/2ML IJ SOLN
INTRAMUSCULAR | Status: AC
  Filled 2021-07-22: qty 2

## 2021-07-22 MED ORDER — SOD CITRATE-CITRIC ACID 500-334 MG/5ML PO SOLN: 30.0000 mL | ORAL | Status: AC

## 2021-07-22 MED ORDER — ACETAMINOPHEN 325 MG PO TABS
650.0000 mg | ORAL_TABLET | ORAL | Status: DC | PRN
  Administered 2021-07-23 – 2021-07-24 (×3): 650 mg via ORAL
  Filled 2021-07-22 (×3): qty 2

## 2021-07-22 MED ORDER — ONDANSETRON HCL 4 MG/2ML IJ SOLN
INTRAMUSCULAR | Status: DC | PRN
  Administered 2021-07-22: 4 mg via INTRAVENOUS

## 2021-07-22 MED ORDER — PHENYLEPHRINE HCL (PRESSORS) 10 MG/ML IV SOLN
INTRAVENOUS | Status: AC
  Filled 2021-07-22: qty 1

## 2021-07-22 MED ORDER — PRENATAL MULTIVITAMIN CH
1.0000 | ORAL_TABLET | Freq: Every day | ORAL | Status: DC
  Administered 2021-07-22 – 2021-07-24 (×3): 1 via ORAL
  Filled 2021-07-22 (×3): qty 1

## 2021-07-22 MED ORDER — PROPOFOL 10 MG/ML IV BOLUS
INTRAVENOUS | Status: AC
  Filled 2021-07-22: qty 20

## 2021-07-22 MED ORDER — OXYTOCIN-SODIUM CHLORIDE 30-0.9 UT/500ML-% IV SOLN
INTRAVENOUS | Status: AC
  Administered 2021-07-22: 2.5 [IU]/h via INTRAVENOUS
  Filled 2021-07-22: qty 500

## 2021-07-22 MED ORDER — MORPHINE SULFATE (PF) 2 MG/ML IV SOLN: 1.0000 mg | INTRAVENOUS | Status: DC | PRN

## 2021-07-22 MED ORDER — SODIUM CHLORIDE 0.9 % IV SOLN
2.0000 g | INTRAVENOUS | Status: AC
  Administered 2021-07-22: 2 g via INTRAVENOUS
  Filled 2021-07-22: qty 2

## 2021-07-22 MED ORDER — KETOROLAC TROMETHAMINE 30 MG/ML IJ SOLN
INTRAMUSCULAR | Status: DC | PRN
  Administered 2021-07-22: 30 mg via INTRAVENOUS

## 2021-07-22 MED ORDER — MORPHINE SULFATE (PF) 0.5 MG/ML IJ SOLN
INTRAMUSCULAR | Status: DC | PRN
  Administered 2021-07-22: 2 mg via EPIDURAL

## 2021-07-22 SURGICAL SUPPLY — 26 items
CATH KIT ON-Q SILVERSOAK 5IN (CATHETERS) ×4 IMPLANT
CHLORAPREP W/TINT 26 (MISCELLANEOUS) ×4 IMPLANT
DERMABOND ADVANCED (GAUZE/BANDAGES/DRESSINGS) ×1
DERMABOND ADVANCED .7 DNX12 (GAUZE/BANDAGES/DRESSINGS) ×1 IMPLANT
DRSG OPSITE POSTOP 4X10 (GAUZE/BANDAGES/DRESSINGS) ×2 IMPLANT
ELECT CAUTERY BLADE 6.4 (BLADE) ×2 IMPLANT
ELECT REM PT RETURN 9FT ADLT (ELECTROSURGICAL) ×2
ELECTRODE REM PT RTRN 9FT ADLT (ELECTROSURGICAL) ×1 IMPLANT
GLOVE SURG NEOPR MICRO LF SZ8 (GLOVE) ×2 IMPLANT
GOWN STRL REUS W/ TWL LRG LVL3 (GOWN DISPOSABLE) ×1 IMPLANT
GOWN STRL REUS W/ TWL XL LVL3 (GOWN DISPOSABLE) ×2 IMPLANT
GOWN STRL REUS W/TWL LRG LVL3 (GOWN DISPOSABLE) ×1
GOWN STRL REUS W/TWL XL LVL3 (GOWN DISPOSABLE) ×2
MANIFOLD NEPTUNE II (INSTRUMENTS) ×2 IMPLANT
MAT PREVALON FULL STRYKER (MISCELLANEOUS) ×2 IMPLANT
NS IRRIG 1000ML POUR BTL (IV SOLUTION) ×2 IMPLANT
PACK C SECTION AR (MISCELLANEOUS) ×2 IMPLANT
PAD OB MATERNITY 4.3X12.25 (PERSONAL CARE ITEMS) ×2 IMPLANT
PAD PREP 24X41 OB/GYN DISP (PERSONAL CARE ITEMS) ×2 IMPLANT
PENCIL SMOKE EVACUATOR (MISCELLANEOUS) ×2 IMPLANT
SCRUB EXIDINE 4% CHG 4OZ (MISCELLANEOUS) ×2 IMPLANT
SUT MAXON ABS #0 GS21 30IN (SUTURE) ×4 IMPLANT
SUT VIC AB 1 CT1 36 (SUTURE) ×6 IMPLANT
SUT VIC AB 2-0 CT1 36 (SUTURE) ×2 IMPLANT
SUT VIC AB 4-0 FS2 27 (SUTURE) ×2 IMPLANT
WATER STERILE IRR 500ML POUR (IV SOLUTION) ×2 IMPLANT

## 2021-07-22 NOTE — Op Note (Signed)
Cesarean Section Procedure Note Indications: malpresentation: Breech, Chondroplasia, and term intrauterine pregnancy  Pre-operative Diagnosis: Intrauterine pregnancy; with malpresentation: Breech Homero Fellers), Achondroplasia, and term intrauterine pregnancy Post-operative Diagnosis: same, delivered. Procedure: Low Transverse Cesarean Section Surgeon: Annamarie Major, MD, FACOG Assistant(s): Dr Bonney Aid, No other capable assistant available, in surgery requiring high level assistant. Anesthesia: Epidural anesthesia Estimated Blood Loss:500 Complications: None; patient tolerated the procedure well. Disposition: PACU - hemodynamically stable. Condition: stable  Findings: A female infant in the breech (frank) presentation. "Ellie" Amniotic fluid - Clear  Birth weight 6-12 lbs.  Apgars of 8 and 9.  Intact placenta with a three-vessel cord. Grossly normal uterus, tubes and ovaries bilaterally. No intraabdominal adhesions were noted.  Procedure Details   The patient was taken to Operating Room, identified as the correct patient and the procedure verified as C-Section Delivery. A Time Out was held and the above information confirmed. After induction of anesthesia, the patient was draped and prepped in the usual sterile manner. A Pfannenstiel incision was made and carried down through the subcutaneous tissue to the fascia. Fascial incision was made and extended transversely with the Mayo scissors. The fascia was separated from the underlying rectus tissue superiorly and inferiorly. The peritoneum was identified and entered bluntly. Peritoneal incision was extended longitudinally. The utero-vesical peritoneal reflection was incised transversely and a bladder flap was created digitally.  A low transverse hysterotomy was made. The fetus was delivered atraumatically. The umbilical cord was clamped x2 and cut and the infant was handed to the awaiting pediatricians. The placenta was removed intact and appeared  normal with a 3-vessel cord.  The uterus was exteriorized and cleared of all clot and debris. The hysterotomy was closed with running sutures of 0 Vicryl suture. A second imbricating layer was placed with the same suture. Excellent hemostasis was observed. The uterus was returned to the abdomen. The pelvis was irrigated and again, excellent hemostasis was noted.  The On Q Pain pump System was then placed.  Trocars were placed through the abdominal wall into the subfascial space and these were used to thread the silver soaker cathaters into place.The rectus fascia was then reapproximated with running sutures of Maxon, with careful placement not to incorporate the cathaters. Subcutaneous tissues are then irrigated with saline and hemostasis assured.  Skin is then closed with 4-0 vicryl suture in a subcuticular fashion followed by skin adhesive. The cathaters are flushed each with 5 mL of Bupivicaine and stabilized into place with dressing. The surgical assistant performed tissue retraction, assistance with suturing, and fundal pressure.   Instrument, sponge, and needle counts were correct prior to the abdominal closure and at the conclusion of the case.  The patient tolerated the procedure well and was transferred to the recovery room in stable condition.   Annamarie Major, MD, [facility], [facility] 07/22/2021  8:46 AM

## 2021-07-22 NOTE — Discharge Summary (Signed)
Postpartum Discharge Summary  Date of Service updated: 07/24/2021     Patient Name: Gina Webster DOB: 12/03/1985 MRN: [id number]  Date of admission: 07/22/2021 Delivery date:07/22/2021  Delivering provider: Gae Dry  Date of discharge: 07/24/2021  Admitting diagnosis: Achondroplasia [Q77.4] Intrauterine pregnancy: [redacted]w[redacted]d    Secondary diagnosis:  Principal Problem:   Supervision of high-risk pregnancy, second trimester Active Problems:   Achondroplasia   Obesity affecting pregnancy in third trimester   [redacted] weeks gestation of pregnancy   Postpartum care following cesarean delivery   Encounter for care or examination of lactating mother  Additional problems: None    Discharge diagnosis: Term Pregnancy Delivered                                              Post partum procedures: none Augmentation: N/A Complications: None  Hospital course: Sceduled C/S   35y.o. yo G1P0000 at 394w4das admitted to the hospital 07/22/2021 for scheduled cesarean section with the following indication: Due to her having Achondroplasia; also baby was breech .Delivery details are as follows:  Membrane Rupture Time/Date: 8:01 AM ,07/22/2021   Delivery Method:C-Section, Low Transverse  Details of operation can be found in separate operative note.  Patient had an uncomplicated postpartum course.  She is ambulating, tolerating a regular diet, passing flatus, and urinating well. Patient is discharged home in stable condition on  07/24/21        Newborn Data: Birth date:07/22/2021  Birth time:8:03 AM  Gender:Female  Living status:Living  Apgars:8 ,  Weight:3060 g     Magnesium Sulfate received: No BMZ received: No Rhophylac:N/A MMR:No T-DaP:Given prenatally Flu: No Transfusion:No  Physical exam  Vitals:   07/23/21 0749 07/23/21 1621 07/23/21 2300 07/24/21 0804  BP: 98/64 114/74 116/65 118/71  Pulse: 79  79 77  Resp: _0 Temp: 98.7 F (37.1 C) 98.8 F (37.1 C) 97.8 F  (36.6 C) 98 F (36.7 C)  TempSrc: Oral Oral Oral Oral  SpO2: 98% 99% 97% 97%  Weight:      Height:       General: alert, cooperative, and no distress Lochia: appropriate Uterine Fundus: firm Incision: Healing well with no significant drainage, Dressing is clean, dry, and intact DVT Evaluation: No evidence of DVT seen on physical exam. Labs: Lab Results  Component Value Date   WBC 8.4 07/23/2021   HGB 11.9 (L) 07/23/2021   HCT 35.0 (L) 07/23/2021   MCV 93.8 07/23/2021   PLT 122 (L) 07/23/2021   No flowsheet data found. Edinburgh Score: Edinburgh Postnatal Depression Scale Screening Tool 07/22/2021  I have been able to laugh and see the funny side of things. 0  I have looked forward with enjoyment to things. 0  I have blamed myself unnecessarily when things went wrong. 1  I have been anxious or worried for no good reason. 2  I have felt scared or panicky for no good reason. 1  Things have been getting on top of me. 0  I have been so unhappy that I have had difficulty sleeping. 1  I have felt sad or miserable. 0  I have been so unhappy that I have been crying. 0  The thought of harming myself has occurred to me. 0  Edinburgh Postnatal Depression Scale Total 5      After visit meds:  Allergies as of 07/24/2021       Reactions   Sulfa Antibiotics Rash        Medication List     STOP taking these medications    calcium citrate 950 (200 Ca) MG tablet Commonly known as: CALCITRATE - dosed in mg elemental calcium       TAKE these medications    acetaminophen 325 MG tablet Commonly known as: TYLENOL Take 650 mg by mouth every 6 (six) hours as needed for moderate pain.   clotrimazole-betamethasone cream Commonly known as: Lotrisone Apply 1 application topically 2 (two) times daily. What changed:  when to take this reasons to take this   ferrous sulfate 325 (65 FE) MG tablet Take 325 mg by mouth daily with breakfast.   oxyCODONE 5 MG immediate release  tablet Commonly known as: Oxy IR/ROXICODONE Take 1 tablet (5 mg total) by mouth every 6 (six) hours as needed for up to 5 days for severe pain.   prenatal vitamin w/FE, FA 29-1 MG Chew chewable tablet Chew 2 tablets by mouth daily at 12 noon.               Discharge Care Instructions  (From admission, onward)           Start     Ordered   07/24/21 0000  Discharge wound care:       Comments: Keep incision dry, clean.   07/24/21 1115             Discharge home in stable condition Infant Feeding: Breast Infant Disposition:home with mother Discharge instruction: per After Visit Summary and Postpartum booklet. Activity: Advance as tolerated. Pelvic rest for 6 weeks.  Diet: routine diet Anticipated Birth Control: PP Depo given Postpartum Appointment:6 weeks Additional Postpartum F/U: Incision check 1-2 weeks Future Appointments: Future Appointments  Date Time Provider [place]  07/31/2021  9:40 AM Gae Dry, MD [facility] None  10/11/2021  1:30 PM [facility] None  10/14/2021  2:45 PM Sindy Guadeloupe, MD [facility] None   Follow up Visit:  Follow-up Information     Gae Dry, MD. Go in 2 week(s).   Specialty: Obstetrics and Gynecology Why: As Scheduled, For Post Op Contact information: [address] [place] [address] [phone]                     07/24/2021 Rod Can, CNM

## 2021-07-22 NOTE — Lactation Note (Signed)
This note was copied from a baby's chart. Lactation Consultation Note  Patient Name: Gina Webster Date: 07/22/2021 Reason for consult: Initial assessment;Primapara;Early term 37-38.6wks;Other (Comment) (c-section) Age:36 hours  Initial lactation visit. Mom is P1, c-section 6 hours ago d/t achondroplasia.  Breastfeedings and output documented since delivery.   LC reviewed newborn stomach size, feeding patterns and behaviors, early cues, feeding on demand, signs that baby is full, position and latch, and sandwiching of the breast tissue for optimal deep latch.  Encouraged 8-12 feeding attempts in first 24 hours and hand expression if baby is sleepy or not desiring to feed. Encouraged skin to skin when possible. Reviewed milk supply and demand and normal course of lactation.   Mom did had questions re: pumping, pumping frequency and introduction. LC encouraged exclusive BF at breast for 3-4 weeks. Briefly discussed how to introduce a pumping routine.   Whiteboard updated, encouraged to call out with next feeding attempt and for ongoing BF support.  Maternal Data Has patient been taught Hand Expression?: Yes Does the patient have breastfeeding experience prior to this delivery?: No  Feeding Mother's Current Feeding Choice: Breast Milk  LATCH Score Latch: Too sleepy or reluctant, no latch achieved, no sucking elicited. (sleepy)                  Lactation Tools Discussed/Used    Interventions Interventions: Breast feeding basics reviewed;Hand express;Education  Discharge Pump: Personal (Medela)  Consult Status Consult Status: Follow-up Date: 07/22/21 Follow-up type: In-patient    Danford Bad 07/22/2021, 2:37 PM

## 2021-07-22 NOTE — Lactation Note (Signed)
This note was copied from a baby's chart. Lactation Consultation Note  Patient Name: Gina Webster [id number] Date: 07/22/2021 Reason for consult: Follow-up assessment;Mother's request;Primapara;Early term 37-38.6wks Age:36 hours  Lactation follow-up. Baby at breast, mom independent with position/latch. Baby active with audible swallows, no pain/discomfort noted with feed.  Encouraged to keep baby awake at the breast, tips given for keeping baby alert. Reminded parents of possible onset of cluster feeding overnight tonight, encouraged rest when possible.  Maternal Data Has patient been taught Hand Expression?: Yes Does the patient have breastfeeding experience prior to this delivery?: No  Feeding Mother's Current Feeding Choice: Breast Milk  LATCH Score Latch: Grasps breast easily, tongue down, lips flanged, rhythmical sucking.  Audible Swallowing: Spontaneous and intermittent  Type of Nipple: Everted at rest and after stimulation  Comfort (Breast/Nipple): Soft / non-tender  Hold (Positioning): No assistance needed to correctly position infant at breast.  LATCH Score: 10   Lactation Tools Discussed/Used    Interventions Interventions: Breast feeding basics reviewed;Education  Discharge Pump: Personal  Consult Status Consult Status: Follow-up Date: 07/22/21 Follow-up type: Call as needed    Danford Bad 07/22/2021, 4:57 PM

## 2021-07-22 NOTE — Anesthesia Preprocedure Evaluation (Signed)
Anesthesia Evaluation  Patient identified by MRN, date of birth, ID band Patient awake    Reviewed: Allergy & Precautions, NPO status , Patient's Chart, lab work & pertinent test results  History of Anesthesia Complications Negative for: history of anesthetic complications  Airway Mallampati: II  TM Distance: >3 FB Neck ROM: Full    Dental  (+) Missing, Partial Lower, Partial Upper   Pulmonary neg pulmonary ROS, neg sleep apnea, neg COPD,    breath sounds clear to auscultation- rhonchi (-) wheezing      Cardiovascular Exercise Tolerance: Good (-) hypertension(-) CAD, (-) Past MI and (-) Cardiac Stents  Rhythm:Regular Rate:Normal - Systolic murmurs and - Diastolic murmurs    Neuro/Psych neg Seizures negative neurological ROS  negative psych ROS   GI/Hepatic negative GI ROS, Neg liver ROS,   Endo/Other  negative endocrine ROSneg diabetes  Renal/GU negative Renal ROS     Musculoskeletal negative musculoskeletal ROS (+)   Abdominal (+) + obese,   Peds  Hematology  (+) anemia ,   Anesthesia Other Findings Past Medical History: No date: Anemia No date: Dwarf, achondroplastic   Reproductive/Obstetrics (+) Pregnancy                             Lab Results  Component Value Date   WBC 7.6 07/19/2021   HGB 13.5 07/19/2021   HCT 38.7 07/19/2021   MCV 92.4 07/19/2021   PLT 144 (L) 07/19/2021    Anesthesia Physical Anesthesia Plan  ASA: 3  Anesthesia Plan: Spinal   Post-op Pain Management:    Induction:   PONV Risk Score and Plan: 2 and Ondansetron  Airway Management Planned: Natural Airway  Additional Equipment:   Intra-op Plan:   Post-operative Plan:   Informed Consent: I have reviewed the patients History and Physical, chart, labs and discussed the procedure including the risks, benefits and alternatives for the proposed anesthesia with the patient or authorized  representative who has indicated his/her understanding and acceptance.     Dental advisory given  Plan Discussed with: CRNA and Anesthesiologist  Anesthesia Plan Comments:         Anesthesia Quick Evaluation

## 2021-07-22 NOTE — Anesthesia Procedure Notes (Signed)
Epidural Patient location during procedure: OB Start time: 07/22/2021 7:38 AM  Staffing Anesthesiologist: Alver Fisher, MD Resident/CRNA: Irving Burton, CRNA  Preanesthetic Checklist Completed: patient identified, IV checked, site marked, risks and benefits discussed, surgical consent, monitors and equipment checked, pre-op evaluation and timeout performed  Epidural Patient position: sitting Prep: ChloraPrep Patient monitoring: heart rate, continuous pulse ox and blood pressure Approach: midline Location: L4-L5 Injection technique: LOR saline  Needle:  Needle type: Tuohy  Needle gauge: 18 G Needle length: 9 cm and 9 Needle insertion depth: 6 cm Catheter type: closed end flexible Catheter size: 20 Guage Catheter at skin depth: 9 cm Test dose: negative and 2% lidocaine with Epi 1:200 K  Assessment Events: blood not aspirated, injection not painful, no injection resistance, no paresthesia and negative IV test  Additional Notes   Patient tolerated the insertion well without complications.Reason for block:procedure for pain

## 2021-07-22 NOTE — Discharge Instructions (Signed)
Discharge Instructions:  ° °Follow-up Appointment:  ° °If there are any new medications, they have been ordered and will be available for pickup at the listed pharmacy on your way home from the hospital.  ° °Call office if you have any of the following: headache, visual changes, fever >101.0 F, chills, shortness of breath, breast concerns, excessive vaginal bleeding, incision drainage or problems, leg pain or redness, depression or any other concerns. If you have vaginal discharge with an odor, let your doctor know.  ° °It is normal to bleed for up to 6 weeks. You should not soak through more than 1 pad in 1 hour. If you have a blood clot larger than your fist with continued bleeding, call your doctor.  ° °After a c-section, you should expect a small amount of blood or clear fluid coming from the incision and abdominal cramping/soreness. Inspect your incision site daily. Stand in front of a mirror to look for any redness, incision opening, or discolored/odorness drainage. Take a shower daily and continue good hygiene. Use own towel and washcloth (do not share). Make sure your sheets on your bed are clean. No pets sleeping around your incision site. Dressing will be removed at your postpartum visit. If the dressing does become wet or soiled underneath, it is okay to remove it.  ° °On-Q pump: You will remove on day 5 after insertion or if the ball becomes flat before day 5. You will remove on: Mar 13, 2019 ° °Activity: Do not lift > 10 lbs for 6 weeks (do not lift anything heavier than your baby). °No intercourse, tampons, swimming pools, hot tubs, baths (only showers) for 6 weeks.  °No driving for 1-2 weeks. °Continue prenatal vitamin, especially if breastfeeding. °Increase calories and fluids (water) while breastfeeding.  ° °Your milk will come in, in the next couple of days (right now it is colostrum). You may have a slight fever when your milk comes in, but it should go away on its own.  If it does not, and rises  above 101 F please call the doctor. You will also feel achy and your breasts will be firm. They will also start to leak. If you are breastfeeding, continue as you have been and you can pump/express milk for comfort.  ° °If you have too much milk, your breasts can become engorged, which could lead to mastitis. This is an infection of the milk ducts. It can be very painful and you will need to notify your doctor to obtain a prescription for antibiotics. You can also treat it with a shower or hot/cold compress.  ° °For concerns about your baby, please call your pediatrician.  °For breastfeeding concerns, the lactation consultant can be reached at [phone].  ° °Postpartum blues (feelings of happy one minute and sad another minute) are normal for the first few weeks but if it gets worse let your doctor know.  ° °Congratulations! We enjoyed caring for you and your new bundle of joy!  °

## 2021-07-22 NOTE — Interval H&P Note (Signed)
History and Physical Interval Note:  07/22/2021 7:15 AM  Gina Webster  has presented today for surgery, with the diagnosis of Achondroplasia, Term pregnancy (38 weeks).  The various methods of treatment have been discussed with the patient and family. After consideration of risks, benefits and other options for treatment, the patient has consented to  Procedure(s): CESAREAN SECTION (N/A) as a surgical intervention.  The patient's history has been reviewed, patient examined, no change in status, stable for surgery.  I have reviewed the patient's chart and labs.  Questions were answered to the patient's satisfaction.     Letitia Libra

## 2021-07-22 NOTE — Progress Notes (Signed)
Foley catheter removed at 2030. Pt tolerated well. Pt able to ambulate around room. Explained to pt that she needed to void within the next 6 hours on her own. She verbalized understanding.

## 2021-07-22 NOTE — Transfer of Care (Signed)
Immediate Anesthesia Transfer of Care Note  Patient: Gina Webster  Procedure(s) Performed: CESAREAN SECTION  Patient Location: LDR6  Anesthesia Type:Epidural  Level of Consciousness: awake, alert  and oriented  Airway & Oxygen Therapy: Patient Spontanous Breathing  Post-op Assessment: Report given to RN and Post -op Vital signs reviewed and stable  Post vital signs: Reviewed and stable  Last Vitals:  Vitals Value Taken Time  BP 159/82 07/22/21 0847  Temp    Pulse 74 07/22/21 0850  Resp 30 07/22/21 0850  SpO2 99 % 07/22/21 0850  Vitals shown include unvalidated device data.  Last Pain:  Vitals:   07/22/21 0615  TempSrc:   PainSc: 0-No pain         Complications: No notable events documented.

## 2021-07-23 LAB — CBC
HCT: 35 % — ABNORMAL LOW (ref 36.0–46.0)
Hemoglobin: 11.9 g/dL — ABNORMAL LOW (ref 12.0–15.0)
MCH: 31.9 pg (ref 26.0–34.0)
MCHC: 34 g/dL (ref 30.0–36.0)
MCV: 93.8 fL (ref 80.0–100.0)
Platelets: 122 10*3/uL — ABNORMAL LOW (ref 150–400)
RBC: 3.73 MIL/uL — ABNORMAL LOW (ref 3.87–5.11)
RDW: 14.1 % (ref 11.5–15.5)
WBC: 8.4 10*3/uL (ref 4.0–10.5)
nRBC: 0 % (ref 0.0–0.2)

## 2021-07-23 MED ORDER — KETOROLAC TROMETHAMINE 30 MG/ML IJ SOLN
INTRAMUSCULAR | Status: AC
  Administered 2021-07-23: 30 mg
  Filled 2021-07-23: qty 1

## 2021-07-23 MED ORDER — MEDROXYPROGESTERONE ACETATE 150 MG/ML IM SUSP
150.0000 mg | Freq: Once | INTRAMUSCULAR | Status: AC
  Administered 2021-07-24: 150 mg via INTRAMUSCULAR
  Filled 2021-07-23: qty 1

## 2021-07-23 NOTE — Lactation Note (Signed)
This note was copied from a baby's chart. Lactation Consultation Note  Patient Name: Girl Gentry Seeber [id number] Date: 07/23/2021 Reason for consult: Follow-up assessment;Primapara;Early term 37-38.6wks;Other (Comment) (c-section) Age:36 hours  Lactation check-in. Mom and baby are doing well, mom up and out of the bed planning to go for a walk. Mom independent with her feedings overnight, notes baby's preference for her R breast "easier" per mom, but continuing to offer both sides equally.  No pain/discomfort reported, no questions at this time. Encouraged 8-12 feedings over next 24 hours, tracking of wet/stool diapers and utilization of BF support as needed.  Maternal Data Has patient been taught Hand Expression?: Yes Does the patient have breastfeeding experience prior to this delivery?: No  Feeding Mother's Current Feeding Choice: Breast Milk  LATCH Score                    Lactation Tools Discussed/Used    Interventions Interventions: Breast feeding basics reviewed;Education  Discharge    Consult Status Consult Status: Follow-up Date: 07/23/21 Follow-up type: Call as needed    Danford Bad 07/23/2021, 12:01 PM

## 2021-07-23 NOTE — Progress Notes (Signed)
Admit Date: 07/22/2021 Today's Date: 07/23/2021  Subjective: Postpartum Day 1: Cesarean Delivery Patient reports tolerating PO and no problems voiding.    Objective: Vital signs in last 24 hours: Temp:  [98.2 F (36.8 C)-99.5 F (37.5 C)] 98.2 F (36.8 C) (09/13 0003) Pulse Rate:  [64-90] 67 (09/13 0003) Resp:  [14-34] 18 (09/13 0003) BP: (97-159)/(55-122) 120/55 (09/13 0003) SpO2:  [95 %-100 %] 97 % (09/13 0003)  Physical Exam:  General: alert, cooperative, and no distress Lochia: appropriate Uterine Fundus: firm Incision: healing well, no significant drainage, no dehiscence, no significant erythema DVT Evaluation: No evidence of DVT seen on physical exam. Negative Homan's sign. No cords or calf tenderness. No significant calf/ankle edema.  Recent Labs    07/23/21 0614  HGB 11.9*  HCT 35.0*    Assessment/Plan: Status post Cesarean section. Doing well postoperatively.  Continue current care. Depo for contraception Infant doing well  Letitia Libra 07/23/2021, 7:41 AM

## 2021-07-23 NOTE — Anesthesia Postprocedure Evaluation (Signed)
Anesthesia Post Note  Patient: Gina Webster  Procedure(s) Performed: CESAREAN SECTION  Patient location during evaluation: Mother Baby Anesthesia Type: Spinal Level of consciousness: oriented and awake and alert Pain management: pain level controlled Vital Signs Assessment: post-procedure vital signs reviewed and stable Respiratory status: spontaneous breathing and respiratory function stable Cardiovascular status: blood pressure returned to baseline and stable Postop Assessment: no headache, no backache, no apparent nausea or vomiting and able to ambulate Anesthetic complications: no   No notable events documented.   Last Vitals:  Vitals:   07/23/21 0003 07/23/21 0749  BP: (!) 120/55 98/64  Pulse: 67 79  Resp: 18 18  Temp: 36.8 C 37.1 C  SpO2: 97% 98%    Last Pain:  Vitals:   07/23/21 0916  TempSrc:   PainSc: 4                  Lynden Oxford

## 2021-07-24 ENCOUNTER — Encounter: Payer: Self-pay | Admitting: *Deleted

## 2021-07-24 MED ORDER — OXYCODONE HCL 5 MG PO TABS: 5.0000 mg | ORAL_TABLET | Freq: Four times a day (QID) | ORAL | 0 refills | Status: AC | PRN

## 2021-07-24 NOTE — Progress Notes (Signed)
Pt d/c home in the company of mother and Significant other. D/c instructions reviewed with pt and she verbalized understanding. Stated she had no further questions.

## 2021-07-24 NOTE — Lactation Note (Signed)
This note was copied from a baby's chart. Lactation Consultation Note  Patient Name: Gina Webster [id number] Date: 07/24/2021 Reason for consult: Follow-up assessment;Primapara;Term;Other (Comment) (c-section) Age:36 hours  Lactation follow-up piror to anticipated discharge. Parents continue to do well with most feedings independently. Baby has voided/stooled, feeding frequently, and passed all 24/36hr testing.   Reviewed with mom growth spurts, cluster feeding, breastfeeding expectations for the next week, 8 or more feedings per 24hrs, keeping baby awake at the breast, feed on demand, and tracking output.   Reviewed anticipated breast changes, mom already starting to experience fullness, ensuring that the breasts are emptying and lighter post feedings, management of breast fullness and engorgement and nipple care.   LC answered parent questions re: pumping and bottle introduction. Reviewed milk supply and demand, normal course of lactation, and benefits of delaying artifical nipples for first 3- 4 weeks.  Outpatient lactation information provided as well as community breastfeeding support. Encouraged to call with questions/concerns and ongoing BF support.   Maternal Data Has patient been taught Hand Expression?: Yes Does the patient have breastfeeding experience prior to this delivery?: No  Feeding Mother's Current Feeding Choice: Breast Milk  LATCH Score Latch: Grasps breast easily, tongue down, lips flanged, rhythmical sucking.  Audible Swallowing: Spontaneous and intermittent  Type of Nipple: Everted at rest and after stimulation  Comfort (Breast/Nipple): Soft / non-tender  Hold (Positioning): No assistance needed to correctly position infant at breast.  LATCH Score: 10   Lactation Tools Discussed/Used    Interventions Interventions: Breast feeding basics reviewed;Adjust position;Support pillows;Education  Discharge Discharge Education: Engorgement and breast  care;Warning signs for feeding baby;Outpatient recommendation  Consult Status Consult Status: Complete Date: 07/24/21 Follow-up type: Call as needed    Danford Bad 07/24/2021, 9:22 AM

## 2021-07-24 NOTE — Lactation Note (Signed)
This note was copied from a baby's chart. Lactation Consultation Note  Patient Name: Girl Marlee Armenteros [id number] Date: 07/24/2021 Reason for consult: Follow-up assessment;Primapara;Term;Other (Comment) (c-section) Age:36 hours  Maternal Data Has patient been taught Hand Expression?: Yes Does the patient have breastfeeding experience prior to this delivery?: No  Feeding Mother's Current Feeding Choice: Breast Milk  LATCH Score Latch: Grasps breast easily, tongue down, lips flanged, rhythmical sucking.  Audible Swallowing: Spontaneous and intermittent  Type of Nipple: Everted at rest and after stimulation  Comfort (Breast/Nipple): Soft / non-tender  Hold (Positioning): No assistance needed to correctly position infant at breast.  LATCH Score: 10   Lactation Tools Discussed/Used    Interventions Interventions: Breast feeding basics reviewed;Adjust position;Support pillows;Education  Discharge Discharge Education: Engorgement and breast care;Warning signs for feeding baby;Outpatient recommendation  Consult Status Consult Status: Complete Date: 07/24/21 Follow-up type: Call as needed    Danford Bad 07/24/2021, 10:25 AM

## 2021-07-31 ENCOUNTER — Other Ambulatory Visit: Payer: Self-pay

## 2021-07-31 ENCOUNTER — Ambulatory Visit (INDEPENDENT_AMBULATORY_CARE_PROVIDER_SITE_OTHER): Payer: BC Managed Care – PPO | Admitting: Obstetrics & Gynecology

## 2021-07-31 ENCOUNTER — Encounter: Payer: Self-pay | Admitting: Obstetrics & Gynecology

## 2021-07-31 VITALS — BP 102/60 | Ht <= 58 in | Wt 134.0 lb

## 2021-07-31 DIAGNOSIS — Z98891 History of uterine scar from previous surgery: Secondary | ICD-10-CM

## 2021-07-31 MED ORDER — METOCLOPRAMIDE HCL 10 MG PO TABS: 10.0000 mg | ORAL_TABLET | Freq: Four times a day (QID) | ORAL | 2 refills | Status: DC | PRN

## 2021-07-31 NOTE — Progress Notes (Signed)
  Postoperative Follow-up Patient presents post op from recent Cesarean Section performed for  primary due to achondroplasia , 1 week ago.   Subjective: Patient reports marked improvement in her immediate post op symptoms. Eating a regular diet without difficulty. Pain is controlled with current analgesics. Medications being used: narcotic analgesics including Percocet.  Activity: normal activities of daily living. Patient reports additional symptom's since surgery of appropriate lochia, no signs of depression, and no signs of mastitis.  Objective: BP 102/60   Ht 4\' 2"  (1.27 m)   Wt 134 lb (60.8 kg)   Breastfeeding Yes   BMI 37.68 kg/m  Physical Exam Constitutional:      General: She is not in acute distress.    Appearance: She is well-developed.  Cardiovascular:     Rate and Rhythm: Normal rate.  Pulmonary:     Effort: Pulmonary effort is normal.  Abdominal:     General: There is no distension.     Palpations: Abdomen is soft.     Tenderness: There is no abdominal tenderness.     Comments: Incision Healing Well   Musculoskeletal:        General: Normal range of motion.  Neurological:     Mental Status: She is alert and oriented to person, place, and time.     Cranial Nerves: No cranial nerve deficit.  Skin:    General: Skin is warm and dry.    Assessment: s/p : Cesarean Section for  her having achondroplasia  progressing well  Plan: Patient has done well after her Cesarean Section with no apparent complications.  I have discussed the post-operative course to date, and the expected progress moving forward.  The patient understands what complications to be concerned about.  I will see the patient in routine follow up, or sooner if needed.    Activity plan: No heavy lifting.  Pelvic rest. She desires Depo-Provera for postpartum contraception. Reglan to aid breast milk supply No s/sx PPD Infant doing well  Marland Kitchen 07/31/2021, 10:07 AM

## 2021-09-05 ENCOUNTER — Ambulatory Visit (INDEPENDENT_AMBULATORY_CARE_PROVIDER_SITE_OTHER): Payer: BC Managed Care – PPO | Admitting: Obstetrics & Gynecology

## 2021-09-05 ENCOUNTER — Other Ambulatory Visit: Payer: Self-pay

## 2021-09-05 ENCOUNTER — Encounter: Payer: Self-pay | Admitting: Obstetrics & Gynecology

## 2021-09-05 MED ORDER — MEDROXYPROGESTERONE ACETATE 150 MG/ML IM SUSP: 150.0000 mg | INTRAMUSCULAR | 3 refills | Status: AC

## 2021-09-05 NOTE — Progress Notes (Signed)
  OBSTETRICS POSTPARTUM CLINIC PROGRESS NOTE  Subjective:     Gina Webster is a 35 y.o. [id number] female who presents for a postpartum visit. She is 6 weeks postpartum following a Term pregnancy, Single fetus, or Pregnancy complicated by: her having achondroplasia and delivery by C-section.  I have fully reviewed the prenatal and intrapartum course. Anesthesia: spinal.  Postpartum course has been complicated by uncomplicated.  Baby is feeding by Breast.  Bleeding: patient has not  resumed menses.  Bowel function is normal. Bladder function is normal.  Patient is not sexually active. Contraception method desired is Depo-Provera injections.  Postpartum depression screening: some anxiety, improving. Edinburgh 8.  The following portions of the patient's history were reviewed and updated as appropriate: allergies, current medications, past family history, past medical history, past social history, past surgical history, and problem list.  Review of Systems Pertinent items are noted in HPI.  Objective:    BP 110/60   Ht 4\' 2"  (1.27 m)   Wt 131 lb (59.4 kg)   Breastfeeding Yes   BMI 36.84 kg/m   General:  alert and no distress   Breasts:  inspection negative, no nipple discharge or bleeding, no masses or nodularity palpable  Lungs: clear to auscultation bilaterally  Heart:  regular rate and rhythm, S1, S2 normal, no murmur, click, rub or gallop  Abdomen: soft, non-tender; bowel sounds normal; no masses,  no organomegaly.  Well healed Pfannenstiel incision   Vulva:  normal  Vagina: normal vagina, no discharge, exudate, lesion, or erythema  Cervix:  no cervical motion tenderness and no lesions  Corpus: normal size, contour, position, consistency, mobility, non-tender  Adnexa:  normal adnexa and no mass, fullness, tenderness  Rectal Exam: Not performed.          Assessment:  Post Partum Care visit 1. Postpartum care following cesarean delivery  Plan:  See orders and Patient  Instructions Resume all normal activities Follow up in: 3 months or as needed.  Depo for contraception Infant checked out well, no signs of limb shortening  , MD, Annamarie Major Ob/Gyn, [facility] 09/05/2021  8:21 AM

## 2021-10-02 ENCOUNTER — Encounter: Payer: Self-pay | Admitting: Oncology

## 2021-10-10 ENCOUNTER — Other Ambulatory Visit: Payer: Self-pay

## 2021-10-10 ENCOUNTER — Ambulatory Visit (INDEPENDENT_AMBULATORY_CARE_PROVIDER_SITE_OTHER): Payer: BC Managed Care – PPO

## 2021-10-10 ENCOUNTER — Telehealth: Payer: Self-pay | Admitting: Oncology

## 2021-10-10 DIAGNOSIS — Z3042 Encounter for surveillance of injectable contraceptive: Secondary | ICD-10-CM | POA: Diagnosis not present

## 2021-10-10 MED ORDER — MEDROXYPROGESTERONE ACETATE 150 MG/ML IM SUSP
150.0000 mg | Freq: Once | INTRAMUSCULAR | Status: AC
  Administered 2021-10-10: 150 mg via INTRAMUSCULAR

## 2021-10-10 NOTE — Progress Notes (Signed)
Patient presents today for Depo Provera injection within dates. Given IM right deltoid per patient request. Patient tolerated well.

## 2021-10-10 NOTE — Telephone Encounter (Signed)
Pt called to cancel her lab appt for today.pt states that she will probably have to cancel MD appt also. Please give her a call at [phone]

## 2021-10-11 ENCOUNTER — Encounter: Payer: Self-pay | Admitting: Oncology

## 2021-10-11 ENCOUNTER — Inpatient Hospital Stay: Payer: BC Managed Care – PPO

## 2021-10-14 ENCOUNTER — Inpatient Hospital Stay: Payer: BC Managed Care – PPO | Admitting: Oncology

## 2021-10-16 ENCOUNTER — Inpatient Hospital Stay: Payer: BC Managed Care – PPO | Attending: Oncology

## 2021-10-16 ENCOUNTER — Other Ambulatory Visit: Payer: Self-pay

## 2021-10-22 ENCOUNTER — Telehealth: Payer: Self-pay | Admitting: *Deleted

## 2021-10-22 ENCOUNTER — Other Ambulatory Visit: Payer: Self-pay

## 2021-10-22 ENCOUNTER — Inpatient Hospital Stay: Payer: BC Managed Care – PPO | Admitting: Oncology

## 2021-10-22 NOTE — Telephone Encounter (Signed)
RN called pt to review chart information and medications for mychart visit at 3p.  RN stated she would call back closer to scheduled time and left desk number if pt wanted to call back.

## 2021-10-22 NOTE — Progress Notes (Signed)
This encounter was created in error - please disregard.

## 2021-10-31 ENCOUNTER — Inpatient Hospital Stay: Payer: BC Managed Care – PPO | Admitting: Oncology

## 2022-04-04 ENCOUNTER — Ambulatory Visit: Payer: BC Managed Care – PPO | Admitting: Advanced Practice Midwife

## 2022-11-12 ENCOUNTER — Encounter: Payer: Self-pay | Admitting: Oncology

## 2023-11-09 ENCOUNTER — Encounter: Payer: Self-pay | Admitting: Oncology

## 2024-01-08 ENCOUNTER — Other Ambulatory Visit: Payer: Self-pay | Admitting: Otolaryngology

## 2024-01-08 DIAGNOSIS — H7191 Unspecified cholesteatoma, right ear: Secondary | ICD-10-CM

## 2024-01-12 ENCOUNTER — Encounter: Payer: Self-pay | Admitting: Oncology

## 2024-01-18 ENCOUNTER — Ambulatory Visit
Admission: RE | Admit: 2024-01-18 | Discharge: 2024-01-18 | Disposition: A | Discharge status: Home / Self Care | Payer: Self-pay | Source: Ambulatory Visit | Attending: Otolaryngology | Admitting: Otolaryngology

## 2024-01-18 DIAGNOSIS — H7191 Unspecified cholesteatoma, right ear: Secondary | ICD-10-CM | POA: Insufficient documentation
